# Patient Record
Sex: Female | Born: 1990 | Race: Black or African American | Hispanic: No | Marital: Single | Smoking: Never smoker
Health system: Southern US, Community
[De-identification: ages and names within clinical notes are randomized; demographics above are authoritative.]

## PROBLEM LIST (undated history)

## (undated) ENCOUNTER — Inpatient Hospital Stay (HOSPITAL_COMMUNITY): Payer: Self-pay

## (undated) DIAGNOSIS — Z8619 Personal history of other infectious and parasitic diseases: Secondary | ICD-10-CM

## (undated) DIAGNOSIS — Z202 Contact with and (suspected) exposure to infections with a predominantly sexual mode of transmission: Secondary | ICD-10-CM

## (undated) DIAGNOSIS — F32A Depression, unspecified: Secondary | ICD-10-CM

## (undated) DIAGNOSIS — D649 Anemia, unspecified: Secondary | ICD-10-CM

## (undated) DIAGNOSIS — R200 Anesthesia of skin: Secondary | ICD-10-CM

## (undated) DIAGNOSIS — F419 Anxiety disorder, unspecified: Secondary | ICD-10-CM

## (undated) DIAGNOSIS — F329 Major depressive disorder, single episode, unspecified: Secondary | ICD-10-CM

## (undated) HISTORY — DX: Major depressive disorder, single episode, unspecified: F32.9

## (undated) HISTORY — DX: Depression, unspecified: F32.A

## (undated) HISTORY — PX: ABDOMINAL SURGERY: SHX537

## (undated) HISTORY — DX: Anxiety disorder, unspecified: F41.9

## (undated) HISTORY — DX: Anesthesia of skin: R20.0

## (undated) HISTORY — DX: Personal history of other infectious and parasitic diseases: Z86.19

## (undated) HISTORY — PX: WISDOM TOOTH EXTRACTION: SHX21

---
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
## 2011-02-03 LAB — WET PREP, GENITAL
Trich, Wet Prep: NONE SEEN
Yeast Wet Prep HPF POC: NONE SEEN

## 2011-02-03 LAB — POCT URINALYSIS DIP (DEVICE)
Bilirubin Urine: NEGATIVE
Glucose, UA: NEGATIVE mg/dL
Ketones, ur: NEGATIVE mg/dL
Nitrite: NEGATIVE
Protein, ur: NEGATIVE mg/dL
Specific Gravity, Urine: 1.015 (ref 1.005–1.030)
Urobilinogen, UA: 0.2 mg/dL (ref 0.0–1.0)
pH: 6.5 (ref 5.0–8.0)

## 2011-02-03 LAB — GC/CHLAMYDIA PROBE AMP, GENITAL
Chlamydia, DNA Probe: NEGATIVE
GC Probe Amp, Genital: NEGATIVE

## 2011-02-03 LAB — POCT PREGNANCY, URINE: Preg Test, Ur: NEGATIVE

## 2011-02-03 LAB — RPR: RPR Ser Ql: NONREACTIVE

## 2011-05-31 ENCOUNTER — Inpatient Hospital Stay (INDEPENDENT_AMBULATORY_CARE_PROVIDER_SITE_OTHER)
Admission: RE | Admit: 2011-05-31 | Discharge: 2011-05-31 | Disposition: A | Discharge status: Home / Self Care | Payer: BC Managed Care – PPO | Source: Ambulatory Visit | Attending: Family Medicine | Admitting: Family Medicine

## 2011-05-31 DIAGNOSIS — J029 Acute pharyngitis, unspecified: Secondary | ICD-10-CM

## 2011-07-16 LAB — POCT URINALYSIS DIP (DEVICE)
Bilirubin Urine: NEGATIVE
Glucose, UA: NEGATIVE
Hgb urine dipstick: NEGATIVE
Ketones, ur: 15 — AB
Nitrite: NEGATIVE
Operator id: 239701
Protein, ur: 30 — AB
Specific Gravity, Urine: 1.025
Urobilinogen, UA: 1
pH: 7

## 2011-07-16 LAB — WET PREP, GENITAL
Clue Cells Wet Prep HPF POC: NONE SEEN
Trich, Wet Prep: NONE SEEN
Yeast Wet Prep HPF POC: NONE SEEN

## 2011-07-16 LAB — POCT PREGNANCY, URINE
Operator id: 2397012
Preg Test, Ur: NEGATIVE

## 2011-07-16 LAB — GC/CHLAMYDIA PROBE AMP, GENITAL
Chlamydia, DNA Probe: POSITIVE — AB
GC Probe Amp, Genital: NEGATIVE

## 2011-07-28 ENCOUNTER — Other Ambulatory Visit: Payer: Self-pay | Admitting: Obstetrics and Gynecology

## 2011-07-28 DIAGNOSIS — N6310 Unspecified lump in the right breast, unspecified quadrant: Secondary | ICD-10-CM

## 2011-08-05 ENCOUNTER — Other Ambulatory Visit: Payer: BC Managed Care – PPO

## 2012-07-24 ENCOUNTER — Emergency Department (INDEPENDENT_AMBULATORY_CARE_PROVIDER_SITE_OTHER)
Admission: EM | Admit: 2012-07-24 | Discharge: 2012-07-24 | Disposition: A | Discharge status: Home / Self Care | Payer: Self-pay | Source: Home / Self Care

## 2012-07-24 ENCOUNTER — Encounter (HOSPITAL_COMMUNITY): Payer: Self-pay

## 2012-07-24 DIAGNOSIS — J029 Acute pharyngitis, unspecified: Secondary | ICD-10-CM

## 2012-07-24 DIAGNOSIS — N72 Inflammatory disease of cervix uteri: Secondary | ICD-10-CM

## 2012-07-24 DIAGNOSIS — N898 Other specified noninflammatory disorders of vagina: Secondary | ICD-10-CM

## 2012-07-24 LAB — WET PREP, GENITAL
Clue Cells Wet Prep HPF POC: NONE SEEN
Trich, Wet Prep: NONE SEEN
Yeast Wet Prep HPF POC: NONE SEEN

## 2012-07-24 LAB — POCT URINALYSIS DIP (DEVICE)
Glucose, UA: NEGATIVE mg/dL
Hgb urine dipstick: NEGATIVE
Ketones, ur: NEGATIVE mg/dL
Leukocytes, UA: NEGATIVE
Protein, ur: NEGATIVE mg/dL
Specific Gravity, Urine: 1.015 (ref 1.005–1.030)
Urobilinogen, UA: 0.2 mg/dL (ref 0.0–1.0)
pH: 7 (ref 5.0–8.0)

## 2012-07-24 LAB — POCT PREGNANCY, URINE: Preg Test, Ur: NEGATIVE

## 2012-07-24 MED ORDER — CEFTRIAXONE SODIUM 250 MG IJ SOLR
INTRAMUSCULAR | Status: AC
  Filled 2012-07-24: qty 250

## 2012-07-24 MED ORDER — LIDOCAINE HCL (PF) 1 % IJ SOLN
INTRAMUSCULAR | Status: AC
  Filled 2012-07-24: qty 5

## 2012-07-24 MED ORDER — AZITHROMYCIN 250 MG PO TABS
1000.0000 mg | ORAL_TABLET | Freq: Every day | ORAL | Status: DC
  Administered 2012-07-24: 1000 mg via ORAL

## 2012-07-24 MED ORDER — AZITHROMYCIN 250 MG PO TABS
ORAL_TABLET | ORAL | Status: AC
  Filled 2012-07-24: qty 4

## 2012-07-24 MED ORDER — CEFTRIAXONE SODIUM 250 MG IJ SOLR
250.0000 mg | Freq: Once | INTRAMUSCULAR | Status: AC
  Administered 2012-07-24: 250 mg via INTRAMUSCULAR

## 2012-07-24 MED ORDER — FLUCONAZOLE 150 MG PO TABS: ORAL_TABLET | ORAL | Status: DC

## 2012-07-24 MED ORDER — AZITHROMYCIN 250 MG PO TABS: ORAL_TABLET | ORAL | Status: DC

## 2012-07-24 NOTE — ED Provider Notes (Signed)
Medical screening examination/treatment/procedure(s) were performed by resident physician or non-physician practitioner and as supervising physician I was immediately available for consultation/collaboration.   Barkley Bruns MD.    Linna Hoff, MD 07/24/12 1859

## 2012-07-24 NOTE — ED Notes (Signed)
C/o ST for 1 week, and poss yeast infection (no relief w OTC monistat) c/o irritated and changes in normal vaginal secretions ; no concern for STD at present, only 1 admitted partner (partner in room , asked while pt in BR , denies any STD syx himself)

## 2012-07-24 NOTE — ED Provider Notes (Signed)
History     CSN: [id number]  Arrival date & time 07/24/12  1146   None     Chief Complaint  Patient presents with  . Sore Throat    (Consider location/radiation/quality/duration/timing/severity/associated sxs/prior treatment) HPI Comments: 21 year old female presents with 2 complaints #1 sore throat for 2 days. This is associated with minor voice changes to come and go and postpharyngeal  drainage. States it is better now denies fever chills earache calls any shortness of breath.  2. She is also complaining of a white thick vaginal discharge for several days. Last night she inserted Monistat vaginal suppository. Associated symptoms include itching and irritation. She also complains of pain discomfort within the vagina. Denies suprapubic or pelvic pain.   History reviewed. No pertinent past medical history.  History reviewed. No pertinent past surgical history.  History reviewed. No pertinent family history.  History  Substance Use Topics  . Smoking status: Not on file  . Smokeless tobacco: Not on file  . Alcohol Use: Not on file    OB History    Grav Para Term Preterm Abortions TAB SAB Ect Mult Living                  Review of Systems  Constitutional: Negative for fever, activity change and fatigue.  HENT: Negative.   Respiratory: Negative.   Gastrointestinal: Negative.   Genitourinary: Positive for vaginal discharge and vaginal pain. Negative for dysuria, frequency, flank pain, vaginal bleeding and difficulty urinating.  Musculoskeletal: Negative.   Neurological: Negative.     Allergies  Review of patient's allergies indicates no known allergies.  Home Medications   Current Outpatient Rx  Name Route Sig Dispense Refill  . AZITHROMYCIN 250 MG PO TABS  4 tabs po now 4 each 0  . FLUCONAZOLE 150 MG PO TABS  1 tab po x 1. May repeat in 72 hours if no improvement 2 tablet 0    BP 112/64  Pulse 80  Temp 98.1 F (36.7 C) (Oral)  Resp 18  SpO2 97%  LMP  07/10/2012  Physical Exam  Constitutional: She is oriented to person, place, and time. She appears well-developed and well-nourished. No distress.  HENT:  Head: Normocephalic and atraumatic.  Right Ear: External ear normal.  Left Ear: External ear normal.  Nose: Nose normal.  Mouth/Throat: Oropharynx is clear and moist. No oropharyngeal exudate.  Eyes: Conjunctivae normal and EOM are normal. Pupils are equal, round, and reactive to light.  Neck: Normal range of motion. Neck supple.  Cardiovascular: Normal rate, regular rhythm and normal heart sounds.   Pulmonary/Chest: Effort normal and breath sounds normal. No respiratory distress. She has no wheezes.  Abdominal: Soft. She exhibits no distension. There is no tenderness. There is no rebound.  Genitourinary:       Pelvic exam: External exam reveals a white discharge over the vulvo vaginal area. Insertion of the speculum and with some resistance. The cervix was captured in the ectocervix is mildly erythematous. There is a copious amount of thick white discharge pitting the vagina and the cervix, difficult to tell how much of this might be the Monistat suppository and the discharge. 2 swabs were obtained. Bimanual: No adnexal tenderness mild cervical motion tenderness is positive.  Musculoskeletal: Normal range of motion. She exhibits no tenderness.  Lymphadenopathy:    She has no cervical adenopathy.  Neurological: She is alert and oriented to person, place, and time. No cranial nerve deficit.  Skin: Skin is warm and dry. No rash noted.  No erythema.    ED Course  Procedures (including critical care time)   Labs Reviewed  POCT URINALYSIS DIP (DEVICE)  POCT RAPID STREP A ([facility] ONLY)  POCT PREGNANCY, URINE  WET PREP, GENITAL  GC/CHLAMYDIA PROBE AMP, GENITAL   No results found.   1. Pharyngitis   2. Vaginal Discharge   3. Cervicitis       MDM  Rocephin 250mg  IM now Azithromycin 1 g by mouth now Rx for Diflucan 150 mg  now and repeat in 2 days.       Hayden Rasmussen, NP 07/24/12 1429

## 2012-07-26 LAB — GC/CHLAMYDIA PROBE AMP, GENITAL
Chlamydia, DNA Probe: NEGATIVE
GC Probe Amp, Genital: NEGATIVE

## 2012-08-30 ENCOUNTER — Other Ambulatory Visit: Payer: Self-pay | Admitting: Obstetrics and Gynecology

## 2012-08-30 DIAGNOSIS — N63 Unspecified lump in unspecified breast: Secondary | ICD-10-CM

## 2012-09-05 ENCOUNTER — Ambulatory Visit
Admission: RE | Admit: 2012-09-05 | Discharge: 2012-09-05 | Disposition: A | Discharge status: Home / Self Care | Payer: BC Managed Care – PPO | Source: Ambulatory Visit | Attending: Obstetrics and Gynecology | Admitting: Obstetrics and Gynecology

## 2012-09-05 DIAGNOSIS — N63 Unspecified lump in unspecified breast: Secondary | ICD-10-CM

## 2012-10-14 ENCOUNTER — Encounter (HOSPITAL_COMMUNITY): Payer: Self-pay | Admitting: Emergency Medicine

## 2012-10-14 ENCOUNTER — Emergency Department (INDEPENDENT_AMBULATORY_CARE_PROVIDER_SITE_OTHER)
Admission: EM | Admit: 2012-10-14 | Discharge: 2012-10-14 | Disposition: A | Discharge status: Home / Self Care | Payer: BC Managed Care – PPO | Source: Home / Self Care | Attending: Family Medicine | Admitting: Family Medicine

## 2012-10-14 DIAGNOSIS — J069 Acute upper respiratory infection, unspecified: Secondary | ICD-10-CM

## 2012-10-14 MED ORDER — IPRATROPIUM BROMIDE 0.06 % NA SOLN: 2.0000 | Freq: Four times a day (QID) | NASAL | Status: DC

## 2012-10-14 NOTE — ED Notes (Signed)
Reports runny nose, ears feel clogged and sore throat which started last week Friday.  OTC medications taken but no relief.  Denies vomiting, nausea, and diarrhea.

## 2012-10-14 NOTE — ED Provider Notes (Signed)
History     CSN: [id number]  Arrival date & time 10/14/12  1605   First MD Initiated Contact with Patient 10/14/12 1655      Chief Complaint  Patient presents with  . URI    (Consider location/radiation/quality/duration/timing/severity/associated sxs/prior treatment) Patient is a 21 y.o. female presenting with URI. The history is provided by the patient.  URI The primary symptoms include sore throat and cough. Primary symptoms do not include fever, swollen glands, nausea, vomiting or rash. The current episode started more than 1 week ago. This is a new problem. The problem has been gradually improving.  Symptoms associated with the illness include congestion and rhinorrhea.    History reviewed. No pertinent past medical history.  History reviewed. No pertinent past surgical history.  History reviewed. No pertinent family history.  History  Substance Use Topics  . Smoking status: Not on file  . Smokeless tobacco: Not on file  . Alcohol Use: Not on file    OB History    Grav Para Term Preterm Abortions TAB SAB Ect Mult Living                  Review of Systems  Constitutional: Negative.  Negative for fever.  HENT: Positive for congestion, sore throat, rhinorrhea and postnasal drip.   Respiratory: Positive for cough.   Cardiovascular: Negative.   Gastrointestinal: Negative.  Negative for nausea and vomiting.  Musculoskeletal: Negative.   Skin: Negative for rash.    Allergies  Review of patient's allergies indicates no known allergies.  Home Medications   Current Outpatient Rx  Name  Route  Sig  Dispense  Refill  . FLUCONAZOLE 150 MG PO TABS      1 tab po x 1. May repeat in 72 hours if no improvement   2 tablet   0   . IPRATROPIUM BROMIDE 0.06 % NA SOLN   Nasal   Place 2 sprays into the nose 4 (four) times daily.   15 mL   1     BP 109/65  Pulse 83  Temp 98.2 F (36.8 C) (Oral)  Resp 20  SpO2 98%  Physical Exam  Nursing note and vitals  reviewed. Constitutional: She is oriented to person, place, and time. She appears well-developed and well-nourished.  HENT:  Head: Normocephalic.  Right Ear: External ear normal.  Left Ear: External ear normal.  Nose: Nose normal.  Mouth/Throat: Oropharynx is clear and moist.  Eyes: Conjunctivae normal are normal. Pupils are equal, round, and reactive to light.  Neck: Normal range of motion. Neck supple.  Cardiovascular: Normal rate, regular rhythm, normal heart sounds and intact distal pulses.   Pulmonary/Chest: Effort normal and breath sounds normal.  Lymphadenopathy:    She has no cervical adenopathy.  Neurological: She is alert and oriented to person, place, and time.  Skin: Skin is warm and dry.    ED Course  Procedures (including critical care time)   Labs Reviewed  POCT RAPID STREP A ([facility] ONLY)   No results found.   No diagnosis found.    MDM          Linna Hoff, MD 10/14/12 [phone]

## 2013-04-06 ENCOUNTER — Encounter (HOSPITAL_COMMUNITY): Payer: Self-pay | Admitting: Emergency Medicine

## 2013-04-06 ENCOUNTER — Emergency Department (INDEPENDENT_AMBULATORY_CARE_PROVIDER_SITE_OTHER)
Admission: EM | Admit: 2013-04-06 | Discharge: 2013-04-06 | Disposition: A | Discharge status: Home / Self Care | Payer: BC Managed Care – PPO | Source: Home / Self Care | Attending: Emergency Medicine | Admitting: Emergency Medicine

## 2013-04-06 DIAGNOSIS — J069 Acute upper respiratory infection, unspecified: Secondary | ICD-10-CM

## 2013-04-06 DIAGNOSIS — J020 Streptococcal pharyngitis: Secondary | ICD-10-CM

## 2013-04-06 LAB — POCT RAPID STREP A: Streptococcus, Group A Screen (Direct): POSITIVE — AB

## 2013-04-06 MED ORDER — PENICILLIN G BENZATHINE 1200000 UNIT/2ML IM SUSP
1.2000 10*6.[IU] | Freq: Once | INTRAMUSCULAR | Status: AC
  Administered 2013-04-06: 1.2 10*6.[IU] via INTRAMUSCULAR

## 2013-04-06 MED ORDER — HYDROCODONE-ACETAMINOPHEN 5-325 MG PO TABS: ORAL_TABLET | ORAL | Status: DC

## 2013-04-06 MED ORDER — PENICILLIN G BENZATHINE 1200000 UNIT/2ML IM SUSP
INTRAMUSCULAR | Status: AC
  Filled 2013-04-06: qty 2

## 2013-04-06 MED ORDER — IBUPROFEN 800 MG PO TABS
800.0000 mg | ORAL_TABLET | Freq: Once | ORAL | Status: AC
  Administered 2013-04-06: 800 mg via ORAL

## 2013-04-06 MED ORDER — IBUPROFEN 800 MG PO TABS
ORAL_TABLET | ORAL | Status: AC
  Filled 2013-04-06: qty 1

## 2013-04-06 NOTE — ED Notes (Signed)
Pt given injection will discharge at 5:50 p.m

## 2013-04-06 NOTE — ED Provider Notes (Signed)
Chief Complaint:   Chief Complaint  Patient presents with  . Sore Throat    x 3 days. hurts to swallow. pain radiates to ears. post nasal drip with green sputum.     History of Present Illness:   Candice Bowman is a 22 year old female who's had a three-day history of severe sore throat, pain with swallowing, postnasal drainage, pain in her ear is, headache, nasal congestion, rhinorrhea with green drainage, sinus pressure, has felt hot, and her eyes have been red and irritated. She denies any fever, chills, skin rash, cough, vomiting, abdominal pain, or diarrhea. No known exposure to strep.  Review of Systems:  Other than as noted above, the patient denies any of the following symptoms. Systemic:  No fever, chills, sweats, fatigue, myalgias, headache, or anorexia. Eye:  No redness, pain or drainage. ENT:  No earache, ear congestion, nasal congestion, sneezing, rhinorrhea, sinus pressure, sinus pain, or post nasal drip. Lungs:  No cough, sputum production, wheezing, shortness of breath, or chest pain. GI:  No abdominal pain, nausea, vomiting, or diarrhea. Skin:  No rash or itching.  PMFSH:  Past medical history, family history, social history, meds, allergies, and nurse's notes were reviewed.  There is no known exposure to strep or mono.  No prior history of step or mono.  The patient denies use of tobacco.  Physical Exam:   Vital signs:  BP 110/67  Pulse 83  Temp(Src) 98.6 F (37 C)  Resp 16  SpO2 100%  LMP 03/24/2013 General:  Alert, in no distress. Eye:  No conjunctival injection or drainage. Lids were normal. ENT:  TMs and canals were normal, without erythema or inflammation.  Nasal mucosa was clear and uncongested, without drainage.  Mucous membranes were moist.  Exam of pharynx reveals erythema and swelling, no exudate.  There were no oral ulcerations or lesions. Neck:  Supple, no adenopathy, tenderness or mass. Lungs:  No respiratory distress.  Lungs were clear to auscultation,  without wheezes, rales or rhonchi.  Breath sounds were clear and equal bilaterally.  Heart:  Regular rhythm, without gallops, murmers or rubs. Skin:  Clear, warm, and dry, without rash or lesions.  Labs:   Results for orders placed during the hospital encounter of 04/06/13  POCT RAPID STREP A ([facility] ONLY)      Result Value Range   Streptococcus, Group A Screen (Direct) POSITIVE (*) NEGATIVE    Course in Urgent Care Center:   Given LA Bicillin 1.2 million units IM.  Assessment:  The encounter diagnosis was Strep throat.  No evidence for peritonsillar abscess.  Plan:   1.  The following meds were prescribed:   Discharge Medication List as of 04/06/2013  5:37 PM    START taking these medications   Details  HYDROcodone-acetaminophen (NORCO/VICODIN) 5-325 MG per tablet 1 to 2 tabs every 4 to 6 hours as needed for pain., Print       2.  The patient was instructed in symptomatic care including hot saline gargles, throat lozenges, infectious precautions, and need to trade out toothbrush. Handouts were given. 3.  The patient was told to return if becoming worse in any way, if no better in 3 or 4 days, and given some red flag symptoms such as difficulty swallowing or breathing that would indicate earlier return. 4.  Follow up here if needed.    Reuben Likes, MD 04/06/13 2123

## 2013-04-06 NOTE — ED Notes (Signed)
Pt c/o severe sore throat x 3 days. Hurts to swallow. Pain radiates towards ears. Post nasal drip with green sputum when clearing throat. "felt hot" Denies n/v/d. Pt has taken otc meds with no relief in symptoms.

## 2013-09-30 ENCOUNTER — Encounter (HOSPITAL_COMMUNITY): Payer: Self-pay | Admitting: Emergency Medicine

## 2013-09-30 ENCOUNTER — Emergency Department (HOSPITAL_COMMUNITY)
Admission: EM | Admit: 2013-09-30 | Discharge: 2013-09-30 | Disposition: A | Discharge status: Home / Self Care | Payer: BC Managed Care – PPO | Attending: Emergency Medicine | Admitting: Emergency Medicine

## 2013-09-30 DIAGNOSIS — Z79899 Other long term (current) drug therapy: Secondary | ICD-10-CM | POA: Insufficient documentation

## 2013-09-30 DIAGNOSIS — W260XXA Contact with knife, initial encounter: Secondary | ICD-10-CM | POA: Insufficient documentation

## 2013-09-30 DIAGNOSIS — Y929 Unspecified place or not applicable: Secondary | ICD-10-CM | POA: Insufficient documentation

## 2013-09-30 DIAGNOSIS — S61219A Laceration without foreign body of unspecified finger without damage to nail, initial encounter: Secondary | ICD-10-CM

## 2013-09-30 DIAGNOSIS — S61209A Unspecified open wound of unspecified finger without damage to nail, initial encounter: Secondary | ICD-10-CM | POA: Insufficient documentation

## 2013-09-30 DIAGNOSIS — Y9389 Activity, other specified: Secondary | ICD-10-CM | POA: Insufficient documentation

## 2013-09-30 DIAGNOSIS — Z23 Encounter for immunization: Secondary | ICD-10-CM | POA: Insufficient documentation

## 2013-09-30 MED ORDER — TETANUS-DIPHTH-ACELL PERTUSSIS 5-2.5-18.5 LF-MCG/0.5 IM SUSP
0.5000 mL | Freq: Once | INTRAMUSCULAR | Status: AC
  Administered 2013-09-30: 0.5 mL via INTRAMUSCULAR
  Filled 2013-09-30: qty 0.5

## 2013-09-30 NOTE — ED Notes (Signed)
Patient states she was cutting a lime the long way and slipped and cut her middle left finger.  Bleeding active, controlled by bandage.  Laceration is about 1/2 inch in length.  Patient is CAOx3.

## 2013-09-30 NOTE — ED Notes (Signed)
Bleeding controlled by dressing at this time. Pt denies any other complaints.

## 2013-09-30 NOTE — ED Notes (Signed)
PA at bedside.

## 2013-09-30 NOTE — ED Provider Notes (Signed)
CSN: [id number]     Arrival date & time 09/30/13  2025 History   First MD Initiated Contact with Patient 09/30/13 2042 This chart was scribed for non-physician practitioner Trixie Dredge, PA-C working with Gerhard Munch, MD by Valera Castle, ED scribe. This patient was seen in room TR06C/TR06C and the patient's care was started at 9:25 PM.  Chief Complaint  Patient presents with  . Extremity Laceration   The history is provided by the patient. No language interpreter was used.   HPI Comments: Candice Bowman is a 22 y.o. female who presents to the Emergency Department complaining of a moderate laceration to her left 3rd finger, with active bleeding and mild, throbbing pain, onset immediately PTA when she slipped and cut her finger while cutting a lime. She reports associated numbness to her finger. She states she is right hand dominant. She denies knowing if her tetanus is UTD. She denies any known allergies. She denies weakness, and any other associated symptoms. She denies any medical history.   PCP - No PCP Per Patient   History reviewed. No pertinent past medical history. History reviewed. No pertinent past surgical history. History reviewed. No pertinent family history. History  Substance Use Topics  . Smoking status: Never Smoker   . Smokeless tobacco: Not on file  . Alcohol Use: No   OB History   Grav Para Term Preterm Abortions TAB SAB Ect Mult Living                 Review of Systems  Skin: Positive for wound (laceration to left 3rd finger).  Neurological: Positive for numbness. Negative for weakness.    Allergies  Review of patient's allergies indicates no known allergies.  Home Medications   Current Outpatient Rx  Name  Route  Sig  Dispense  Refill  . fluconazole (DIFLUCAN) 150 MG tablet      1 tab po x 1. May repeat in 72 hours if no improvement   2 tablet   0   . HYDROcodone-acetaminophen (NORCO/VICODIN) 5-325 MG per tablet      1 to 2 tabs every 4 to 6  hours as needed for pain.   20 tablet   0   . ipratropium (ATROVENT) 0.06 % nasal spray   Nasal   Place 2 sprays into the nose 4 (four) times daily.   15 mL   1    BP 109/66  Pulse 81  Temp(Src) 98.3 F (36.8 C) (Oral)  Resp 15  Ht 5\' 5"  (1.651 m)  Wt 119 lb 11.2 oz (54.296 kg)  BMI 19.92 kg/m2  SpO2 97%  LMP 09/17/2013  Physical Exam  Nursing note and vitals reviewed. Constitutional: She appears well-developed and well-nourished. No distress.  HENT:  Head: Normocephalic and atraumatic.  Neck: Neck supple.  Pulmonary/Chest: Effort normal.  Neurological: She is alert.  Skin: She is not diaphoretic.  Linear laceration over dorsal aspect of left third finger.  Actively bleeding.  Sensation decreased distally.  Full AROM of finger.  Capillary refill < 2 seconds.      ED Course  Procedures (including critical care time)  DIAGNOSTIC STUDIES: Oxygen Saturation is 97% on room air, normal by my interpretation.    COORDINATION OF CARE: 9:28 PM-Discussed treatment plan which includes laceration repair with pt at bedside and pt agreed to plan.   LACERATION REPAIR PROCEDURE NOTE The patient's identification was confirmed and consent was obtained. This procedure was performed by Trixie Dredge, PA-C at 9:33 PM. Site: Left  3rd digit Sterile procedures observed: Saline Anesthetic used (type and amt): Digital Block, 3 cc 2% Lidocaine without Epinephrine Suture type/size: 5.0 Vicryl Length: 1.5 cm # of Sutures: 4 Technique: simple interrupted Complexity: Simple Tetanus ordered  Labs Review Labs Reviewed - No data to display Imaging Review No results found.  EKG Interpretation   None       MDM   1. Finger laceration, initial encounter    Pt with laceration over dorsal aspect of left 3rd finger.  No tendon involvement.  Wound hemostatic after suturing.  Discussed findings, treatment, and follow up  with patient.  Pt given return precautions.  Pt verbalizes understanding  and agrees with plan.         I personally performed the services described in this documentation, which was scribed in my presence. The recorded information has been reviewed and is accurate.    Trixie Dredge, PA-C 09/30/13 [phone]

## 2013-10-01 NOTE — ED Provider Notes (Signed)
  Medical screening examination/treatment/procedure(s) were performed by non-physician practitioner and as supervising physician I was immediately available for consultation/collaboration.  EKG Interpretation   None          Gotti Alwin, MD 10/01/13 0034 

## 2013-12-12 ENCOUNTER — Other Ambulatory Visit (HOSPITAL_COMMUNITY)
Admission: RE | Admit: 2013-12-12 | Discharge: 2013-12-12 | Disposition: A | Discharge status: Home / Self Care | Payer: BC Managed Care – PPO | Source: Ambulatory Visit | Attending: Family Medicine | Admitting: Family Medicine

## 2013-12-12 DIAGNOSIS — Z01419 Encounter for gynecological examination (general) (routine) without abnormal findings: Secondary | ICD-10-CM | POA: Insufficient documentation

## 2014-06-30 ENCOUNTER — Emergency Department (HOSPITAL_COMMUNITY)
Admission: EM | Admit: 2014-06-30 | Discharge: 2014-06-30 | Disposition: A | Discharge status: Home / Self Care | Payer: BC Managed Care – PPO

## 2015-10-27 NOTE — L&D Delivery Note (Signed)
Pt progressed to complete and plus one station.  After pushing she began to crown and was taken to the operating room.  Baby girl A was delivered ROA position at 1621 with apgars of 8,9.  SVD  Without difficulty.   Attempted ECV of baby B but it was unsuccessful.  She then agreed to deliver him breech .  I grabbed the feet and then AROM, clear fluid.  The infant was the delivered using breech manuvers at 1635 with apgars of 4,9.  Cord gasses were obtained.  Second degree perineal laceration repaired with 2-0 chromic.  Right second degree periurethral laceration repired with 3-0 chromic.  EBL 450 cc

## 2015-11-08 LAB — OB RESULTS CONSOLE ABO/RH: RH TYPE: POSITIVE

## 2015-11-08 LAB — OB RESULTS CONSOLE ANTIBODY SCREEN: ANTIBODY SCREEN: NEGATIVE

## 2015-11-08 LAB — OB RESULTS CONSOLE GC/CHLAMYDIA
Chlamydia: NEGATIVE
Gonorrhea: NEGATIVE

## 2015-11-08 LAB — OB RESULTS CONSOLE RUBELLA ANTIBODY, IGM: Rubella: IMMUNE

## 2015-11-08 LAB — OB RESULTS CONSOLE HEPATITIS B SURFACE ANTIGEN: Hepatitis B Surface Ag: NEGATIVE

## 2015-11-08 LAB — OB RESULTS CONSOLE HIV ANTIBODY (ROUTINE TESTING): HIV: NONREACTIVE

## 2015-11-08 LAB — OB RESULTS CONSOLE RPR: RPR: NONREACTIVE

## 2015-12-18 ENCOUNTER — Emergency Department (INDEPENDENT_AMBULATORY_CARE_PROVIDER_SITE_OTHER)
Admission: EM | Admit: 2015-12-18 | Discharge: 2015-12-18 | Disposition: A | Discharge status: Home / Self Care | Payer: Medicaid Other | Source: Home / Self Care | Attending: Emergency Medicine | Admitting: Emergency Medicine

## 2015-12-18 ENCOUNTER — Encounter (HOSPITAL_COMMUNITY): Payer: Self-pay | Admitting: Emergency Medicine

## 2015-12-18 DIAGNOSIS — J029 Acute pharyngitis, unspecified: Secondary | ICD-10-CM | POA: Diagnosis not present

## 2015-12-18 MED ORDER — FLUTICASONE PROPIONATE 50 MCG/ACT NA SUSP: 2.0000 | Freq: Every day | NASAL | Status: DC

## 2015-12-18 MED ORDER — LORATADINE 10 MG PO TABS: 10.0000 mg | ORAL_TABLET | Freq: Every day | ORAL | Status: DC

## 2015-12-18 NOTE — ED Provider Notes (Signed)
  HPI  SUBJECTIVE:  Patient reports a right-sided sore throat starting  3 days ago. Sx worse with at night and with swallowing.  Sx better with nothing. Has not tried anything for this. Patient is 4 months pregnant with twins confirmed by ultrasound.. She denies contractions, vaginal bleeding, leakage of fluid. She has been receiving regular prenatal care no Swollen neck glands    + Nasal congestion, minimal cough. No Myalgias No nausea, vomiting, fevers. No Headache No Rash     No Recent Strep Exposure No Abdominal Pain No reflux sxs Questionable Allergy - reports sneezing, but no itchy, watery eyes.  No Breathing difficulty, voice changes No Drooling No Trismus Patient has had sick contacts, steps somewhat similar symptoms. No antipyretic in past 4-6 hrs. Past medical history seasonal allergies, which get worse in the spring. No history of GERD, mono.   History reviewed. No pertinent past medical history.  History reviewed. No pertinent past surgical history.  History reviewed. No pertinent family history.  Social History  Substance Use Topics  . Smoking status: Never Smoker   . Smokeless tobacco: None  . Alcohol Use: No    No current facility-administered medications for this encounter.  Current outpatient prescriptions:  .  Prenatal MV-Min-Fe Fum-FA-DHA (PRENATAL 1 PO), Take by mouth., Disp: , Rfl:  .  fluticasone (FLONASE) 50 MCG/ACT nasal spray, Place 2 sprays into both nostrils daily., Disp: 16 g, Rfl: 0 .  loratadine (CLARITIN) 10 MG tablet, Take 1 tablet (10 mg total) by mouth daily., Disp: 30 tablet, Rfl: 0  No Known Allergies   ROS  As noted in HPI.   Physical Exam  BP 108/78 mmHg  Pulse 74  Temp(Src) 98 F (36.7 C) (Oral)  Resp 17  SpO2 100%  Constitutional: Well developed, well nourished, no acute distress Eyes:  EOMI, conjunctiva normal bilaterally HENT: Normocephalic, atraumatic,mucus membranes moist. +  nasal congestion - erythematous  oropharynx - enlarged tonsils  - exudates. Uvula midline. No sinus tenderness. TMs normal bilaterally. Respiratory: Normal inspiratory effort Cardiovascular: Normal rate, no murmurs, rubs, gallops GI: nondistended, nontender. No appreciable splenomegaly fundus height consistent with dates. skin: No rash, skin intact Lymph: -  cervical LN  Musculoskeletal: no deformities Neurologic: Alert & oriented x 3, no focal neuro deficits Psychiatric: Speech and behavior appropriate. At baseline mental status per caregiver.   ED Course   Medications - No data to display  No orders of the defined types were placed in this encounter.    No results found for this or any previous visit (from the past 24 hour(s)). No results found.  ED Clinical Impression  Pharyngitis   ED Assessment/Plan  Oropharynx normal.  doubt strep. Deferring testing today. Presentation consistent with either viral infection versus seasonal allergies. Home with  Flonase, saline nasal irrigation, Claritin, Tylenol. Follow-up with PMD as needed. Discussed signs and symptoms that should prompt return to the emergency department. Patient agrees with plan.  *This clinic note was created using Dragon dictation software. Therefore, there may be occasional mistakes despite careful proofreading.    Domenick Gong, MD 12/18/15 2130

## 2015-12-18 NOTE — ED Notes (Signed)
Pt has been suffering from nasal congestion, a cough, and right sided throat pain for three days.  Pt is pregnant and did not want to take anything OTC.

## 2016-04-30 LAB — OB RESULTS CONSOLE GBS: STREP GROUP B AG: NEGATIVE

## 2016-05-06 ENCOUNTER — Inpatient Hospital Stay (HOSPITAL_COMMUNITY): Payer: BLUE CROSS/BLUE SHIELD

## 2016-05-06 ENCOUNTER — Inpatient Hospital Stay (HOSPITAL_COMMUNITY)
Admission: AD | Admit: 2016-05-06 | Discharge: 2016-05-06 | Disposition: A | Discharge status: Home / Self Care | Payer: BLUE CROSS/BLUE SHIELD | Source: Ambulatory Visit | Attending: Obstetrics and Gynecology | Admitting: Obstetrics and Gynecology

## 2016-05-06 ENCOUNTER — Encounter (HOSPITAL_COMMUNITY): Payer: Self-pay | Admitting: *Deleted

## 2016-05-06 DIAGNOSIS — O368130 Decreased fetal movements, third trimester, not applicable or unspecified: Secondary | ICD-10-CM | POA: Diagnosis not present

## 2016-05-06 DIAGNOSIS — Z3A35 35 weeks gestation of pregnancy: Secondary | ICD-10-CM | POA: Insufficient documentation

## 2016-05-06 DIAGNOSIS — O30003 Twin pregnancy, unspecified number of placenta and unspecified number of amniotic sacs, third trimester: Secondary | ICD-10-CM | POA: Diagnosis not present

## 2016-05-06 DIAGNOSIS — R109 Unspecified abdominal pain: Secondary | ICD-10-CM | POA: Diagnosis present

## 2016-05-06 DIAGNOSIS — O368190 Decreased fetal movements, unspecified trimester, not applicable or unspecified: Secondary | ICD-10-CM

## 2016-05-06 HISTORY — DX: Anemia, unspecified: D64.9

## 2016-05-06 HISTORY — DX: Contact with and (suspected) exposure to infections with a predominantly sexual mode of transmission: Z20.2

## 2016-05-06 LAB — AMNISURE RUPTURE OF MEMBRANE (ROM) NOT AT ARMC: AMNISURE: NEGATIVE

## 2016-05-06 NOTE — MAU Provider Note (Signed)
Assumed care.  S: Call from RN that pt is also c/o LOF. +Cramping, but bearable. Declines Tylenol. Reports FM of both babies at present.  O: Today's Vitals   05/06/16 1710 05/06/16 2045  BP: 117/64 104/65  Pulse: 88 88  Temp: 98.3 F (36.8 C)   TempSrc: Oral   Resp: 16 16  Height: 5\' 5"  (1.651 m)   Weight: 76.204 kg (168 lb)    SSE: small amount of white non-odorous discharge present. Sample collected for fern slide and Amnisure. No pooling or valsalva. Fern neg. Cvx visually closed.    Results for orders placed or performed during the hospital encounter of 05/06/16 (from the past 24 hour(s))  Amnisure rupture [facility] membrane (rom)[facility]     Status: None   Collection Time: 05/06/16  8:20 PM  Result Value Ref Range   Amnisure ROM NEGATIVE      A: 25 yo G1P0 @ 35.5 wks here for ? ROM and decreased FM of baby B. Preliminary U/S report = BPP 8/8 for both fetuses w/ largest fluid pocket of 2.4 cm for Twin A, and 4.3 cm for Twin B. Twin A cephalic. Twin B Breech. +Ctxs & irritability on toco, palpate mild. ROM ruled out - appears leukorrhea.   P: Reassurance. Strip reviewed by Dr. Sallye OberKulwa. Ok to send home w/ strict PTL precautions. Continue FKCs per protocol. Tyl prn cramping. Rest. Hydration. Condoms during intercourse. OB f/u as scheduled.   Sherre ScarletKimberly Lennix Bowman, CNM 05/06/16, 8:39 PM

## 2016-05-06 NOTE — Discharge Instructions (Signed)
Fetal Movement Counts  Patient Name: __________________________________________________ Patient Due Date: ____________________  Performing a fetal movement count is highly recommended in high-risk pregnancies, but it is good for every pregnant woman to do. Your health care provider may ask you to start counting fetal movements at 28 weeks of the pregnancy. Fetal movements often increase:  · After eating a full meal.  · After physical activity.  · After eating or drinking something sweet or cold.  · At rest.  Pay attention to when you feel the baby is most active. This will help you notice a pattern of your baby's sleep and wake cycles and what factors contribute to an increase in fetal movement. It is important to perform a fetal movement count at the same time each day when your baby is normally most active.   HOW TO COUNT FETAL MOVEMENTS  1. Find a quiet and comfortable area to sit or lie down on your left side. Lying on your left side provides the best blood and oxygen circulation to your baby.  2. Write down the day and time on a sheet of paper or in a journal.  3. Start counting kicks, flutters, swishes, rolls, or jabs in a 2-hour period. You should feel at least 10 movements within 2 hours.  4. If you do not feel 10 movements in 2 hours, wait 2-3 hours and count again. Look for a change in the pattern or not enough counts in 2 hours.  SEEK MEDICAL CARE IF:  · You feel less than 10 counts in 2 hours, tried twice.  · There is no movement in over an hour.  · The pattern is changing or taking longer each day to reach 10 counts in 2 hours.  · You feel the baby is not moving as he or she usually does.  Date: ____________ Movements: ____________ Start time: ____________ Finish time: ____________   Date: ____________ Movements: ____________ Start time: ____________ Finish time: ____________  Date: ____________ Movements: ____________ Start time: ____________ Finish time: ____________  Date: ____________ Movements:  ____________ Start time: ____________ Finish time: ____________  Date: ____________ Movements: ____________ Start time: ____________ Finish time: ____________  Date: ____________ Movements: ____________ Start time: ____________ Finish time: ____________  Date: ____________ Movements: ____________ Start time: ____________ Finish time: ____________  Date: ____________ Movements: ____________ Start time: ____________ Finish time: ____________   Date: ____________ Movements: ____________ Start time: ____________ Finish time: ____________  Date: ____________ Movements: ____________ Start time: ____________ Finish time: ____________  Date: ____________ Movements: ____________ Start time: ____________ Finish time: ____________  Date: ____________ Movements: ____________ Start time: ____________ Finish time: ____________  Date: ____________ Movements: ____________ Start time: ____________ Finish time: ____________  Date: ____________ Movements: ____________ Start time: ____________ Finish time: ____________  Date: ____________ Movements: ____________ Start time: ____________ Finish time: ____________   Date: ____________ Movements: ____________ Start time: ____________ Finish time: ____________  Date: ____________ Movements: ____________ Start time: ____________ Finish time: ____________  Date: ____________ Movements: ____________ Start time: ____________ Finish time: ____________  Date: ____________ Movements: ____________ Start time: ____________ Finish time: ____________  Date: ____________ Movements: ____________ Start time: ____________ Finish time: ____________  Date: ____________ Movements: ____________ Start time: ____________ Finish time: ____________  Date: ____________ Movements: ____________ Start time: ____________ Finish time: ____________   Date: ____________ Movements: ____________ Start time: ____________ Finish time: ____________  Date: ____________ Movements: ____________ Start time: ____________ Finish  time: ____________  Date: ____________ Movements: ____________ Start time: ____________ Finish time: ____________  Date: ____________ Movements: ____________ Start time:   ____________ Finish time: ____________  Date: ____________ Movements: ____________ Start time: ____________ Finish time: ____________  Date: ____________ Movements: ____________ Start time: ____________ Finish time: ____________  Date: ____________ Movements: ____________ Start time: ____________ Finish time: ____________   Date: ____________ Movements: ____________ Start time: ____________ Finish time: ____________  Date: ____________ Movements: ____________ Start time: ____________ Finish time: ____________  Date: ____________ Movements: ____________ Start time: ____________ Finish time: ____________  Date: ____________ Movements: ____________ Start time: ____________ Finish time: ____________  Date: ____________ Movements: ____________ Start time: ____________ Finish time: ____________  Date: ____________ Movements: ____________ Start time: ____________ Finish time: ____________  Date: ____________ Movements: ____________ Start time: ____________ Finish time: ____________   Date: ____________ Movements: ____________ Start time: ____________ Finish time: ____________  Date: ____________ Movements: ____________ Start time: ____________ Finish time: ____________  Date: ____________ Movements: ____________ Start time: ____________ Finish time: ____________  Date: ____________ Movements: ____________ Start time: ____________ Finish time: ____________  Date: ____________ Movements: ____________ Start time: ____________ Finish time: ____________  Date: ____________ Movements: ____________ Start time: ____________ Finish time: ____________  Date: ____________ Movements: ____________ Start time: ____________ Finish time: ____________   Date: ____________ Movements: ____________ Start time: ____________ Finish time: ____________  Date: ____________  Movements: ____________ Start time: ____________ Finish time: ____________  Date: ____________ Movements: ____________ Start time: ____________ Finish time: ____________  Date: ____________ Movements: ____________ Start time: ____________ Finish time: ____________  Date: ____________ Movements: ____________ Start time: ____________ Finish time: ____________  Date: ____________ Movements: ____________ Start time: ____________ Finish time: ____________  Date: ____________ Movements: ____________ Start time: ____________ Finish time: ____________   Date: ____________ Movements: ____________ Start time: ____________ Finish time: ____________  Date: ____________ Movements: ____________ Start time: ____________ Finish time: ____________  Date: ____________ Movements: ____________ Start time: ____________ Finish time: ____________  Date: ____________ Movements: ____________ Start time: ____________ Finish time: ____________  Date: ____________ Movements: ____________ Start time: ____________ Finish time: ____________  Date: ____________ Movements: ____________ Start time: ____________ Finish time: ____________     This information is not intended to replace advice given to you by your health care provider. Make sure you discuss any questions you have with your health care provider.     Document Released: 11/11/2006 Document Revised: 11/02/2014 Document Reviewed: 08/08/2012  Elsevier Interactive Patient Education ©2016 [employer]

## 2016-05-18 ENCOUNTER — Inpatient Hospital Stay (HOSPITAL_COMMUNITY)
Admission: AD | Admit: 2016-05-18 | Discharge: 2016-05-19 | Disposition: A | Discharge status: Home / Self Care | Payer: BLUE CROSS/BLUE SHIELD | Source: Ambulatory Visit | Attending: Obstetrics & Gynecology | Admitting: Obstetrics & Gynecology

## 2016-05-18 ENCOUNTER — Encounter (HOSPITAL_COMMUNITY): Payer: Self-pay | Admitting: *Deleted

## 2016-05-18 DIAGNOSIS — Z3493 Encounter for supervision of normal pregnancy, unspecified, third trimester: Secondary | ICD-10-CM | POA: Insufficient documentation

## 2016-05-18 DIAGNOSIS — Z3A38 38 weeks gestation of pregnancy: Secondary | ICD-10-CM | POA: Insufficient documentation

## 2016-05-18 NOTE — MAU Note (Signed)
Pt reports increasing cramping. States it has been happening for 2 days but worsening tonight. Denies bleeding or ROM

## 2016-05-19 DIAGNOSIS — Z3493 Encounter for supervision of normal pregnancy, unspecified, third trimester: Secondary | ICD-10-CM | POA: Diagnosis present

## 2016-05-19 DIAGNOSIS — Z3A38 38 weeks gestation of pregnancy: Secondary | ICD-10-CM | POA: Diagnosis not present

## 2016-05-19 NOTE — Discharge Instructions (Signed)
Braxton Hicks Contractions °Contractions of the uterus can occur throughout pregnancy. Contractions are not always a sign that you are in labor.  °WHAT ARE BRAXTON HICKS CONTRACTIONS?  °Contractions that occur before labor are called Braxton Hicks contractions, or false labor. Toward the end of pregnancy (32-34 weeks), these contractions can develop more often and may become more forceful. This is not true labor because these contractions do not result in opening (dilatation) and thinning of the cervix. They are sometimes difficult to tell apart from true labor because these contractions can be forceful and people have different pain tolerances. You should not feel embarrassed if you go to the hospital with false labor. Sometimes, the only way to tell if you are in true labor is for your health care provider to look for changes in the cervix. °If there are no prenatal problems or other health problems associated with the pregnancy, it is completely safe to be sent home with false labor and await the onset of true labor. °HOW CAN YOU TELL THE DIFFERENCE BETWEEN TRUE AND FALSE LABOR? °False Labor °· The contractions of false labor are usually shorter and not as hard as those of true labor.   °· The contractions are usually irregular.   °· The contractions are often felt in the front of the lower abdomen and in the groin.   °· The contractions may go away when you walk around or change positions while lying down.   °· The contractions get weaker and are shorter lasting as time goes on.   °· The contractions do not usually become progressively stronger, regular, and closer together as with true labor.   °True Labor °· Contractions in true labor last 30-70 seconds, become very regular, usually become more intense, and increase in frequency.   °· The contractions do not go away with walking.   °· The discomfort is usually felt in the top of the uterus and spreads to the lower abdomen and low back.   °· True labor can be  determined by your health care provider with an exam. This will show that the cervix is dilating and getting thinner.   °WHAT TO REMEMBER °· Keep up with your usual exercises and follow other instructions given by your health care provider.   °· Take medicines as directed by your health care provider.   °· Keep your regular prenatal appointments.   °· Eat and drink lightly if you think you are going into labor.   °· If Braxton Hicks contractions are making you uncomfortable:   °¨ Change your position from lying down or resting to walking, or from walking to resting.   °¨ Sit and rest in a tub of warm water.   °¨ Drink 2-3 glasses of water. Dehydration may cause these contractions.   °¨ Do slow and deep breathing several times an hour.   °WHEN SHOULD I SEEK IMMEDIATE MEDICAL CARE? °Seek immediate medical care if: °· Your contractions become stronger, more regular, and closer together.   °· You have fluid leaking or gushing from your vagina.   °· You have a fever.   °· You pass blood-tinged mucus.   °· You have vaginal bleeding.   °· You have continuous abdominal pain.   °· You have low back pain that you never had before.   °· You feel your baby's head pushing down and causing pelvic pressure.   °· Your baby is not moving as much as it used to.   °  °This information is not intended to replace advice given to you by your health care provider. Make sure you discuss any questions you have with your health care   provider. °  °Document Released: 10/12/2005 Document Revised: 10/17/2013 Document Reviewed: 07/24/2013 °Elsevier Interactive Patient Education ©2016 Elsevier Inc. ° °

## 2016-05-21 ENCOUNTER — Telehealth (HOSPITAL_COMMUNITY): Payer: Self-pay | Admitting: *Deleted

## 2016-05-21 ENCOUNTER — Encounter (HOSPITAL_COMMUNITY): Payer: Self-pay | Admitting: *Deleted

## 2016-05-21 NOTE — Telephone Encounter (Signed)
Preadmission screen  

## 2016-05-26 ENCOUNTER — Encounter (HOSPITAL_COMMUNITY): Payer: Self-pay | Admitting: *Deleted

## 2016-05-26 ENCOUNTER — Other Ambulatory Visit: Payer: Self-pay | Admitting: Obstetrics and Gynecology

## 2016-05-26 ENCOUNTER — Inpatient Hospital Stay (HOSPITAL_COMMUNITY)
Admission: AD | Admit: 2016-05-26 | Discharge: 2016-05-26 | Disposition: A | Discharge status: Home / Self Care | Payer: BLUE CROSS/BLUE SHIELD | Source: Ambulatory Visit | Attending: Obstetrics & Gynecology | Admitting: Obstetrics & Gynecology

## 2016-05-26 DIAGNOSIS — Z3493 Encounter for supervision of normal pregnancy, unspecified, third trimester: Secondary | ICD-10-CM

## 2016-05-26 NOTE — Discharge Instructions (Signed)
Keep your appointment for induction tomorrow. Call the office or provider on call with further concerns prior to your appointment or return to MAU as needed.

## 2016-05-27 ENCOUNTER — Inpatient Hospital Stay (HOSPITAL_COMMUNITY)
Admission: RE | Admit: 2016-05-27 | Discharge: 2016-05-29 | DRG: 775 | Disposition: A | Discharge status: Home / Self Care | Payer: BLUE CROSS/BLUE SHIELD | Source: Ambulatory Visit | Attending: Obstetrics and Gynecology | Admitting: Obstetrics and Gynecology

## 2016-05-27 ENCOUNTER — Inpatient Hospital Stay (HOSPITAL_COMMUNITY): Payer: BLUE CROSS/BLUE SHIELD | Admitting: Anesthesiology

## 2016-05-27 ENCOUNTER — Encounter (HOSPITAL_COMMUNITY)
Admission: RE | Disposition: A | Discharge status: Home / Self Care | Payer: Self-pay | Source: Ambulatory Visit | Attending: Obstetrics and Gynecology

## 2016-05-27 ENCOUNTER — Encounter (HOSPITAL_COMMUNITY): Payer: Self-pay

## 2016-05-27 DIAGNOSIS — O321XX2 Maternal care for breech presentation, fetus 2: Secondary | ICD-10-CM | POA: Diagnosis present

## 2016-05-27 DIAGNOSIS — D649 Anemia, unspecified: Secondary | ICD-10-CM | POA: Diagnosis present

## 2016-05-27 DIAGNOSIS — Z3A38 38 weeks gestation of pregnancy: Secondary | ICD-10-CM

## 2016-05-27 DIAGNOSIS — Z818 Family history of other mental and behavioral disorders: Secondary | ICD-10-CM

## 2016-05-27 DIAGNOSIS — O9902 Anemia complicating childbirth: Secondary | ICD-10-CM | POA: Diagnosis present

## 2016-05-27 DIAGNOSIS — IMO0001 Reserved for inherently not codable concepts without codable children: Secondary | ICD-10-CM

## 2016-05-27 DIAGNOSIS — O30043 Twin pregnancy, dichorionic/diamniotic, third trimester: Secondary | ICD-10-CM | POA: Diagnosis present

## 2016-05-27 LAB — CBC
HEMATOCRIT: 32.5 % — AB (ref 36.0–46.0)
HEMOGLOBIN: 11.3 g/dL — AB (ref 12.0–15.0)
MCH: 28.8 pg (ref 26.0–34.0)
MCHC: 34.8 g/dL (ref 30.0–36.0)
MCV: 82.9 fL (ref 78.0–100.0)
Platelets: 227 10*3/uL (ref 150–400)
RBC: 3.92 MIL/uL (ref 3.87–5.11)
RDW: 14.9 % (ref 11.5–15.5)
WBC: 12.4 10*3/uL — AB (ref 4.0–10.5)

## 2016-05-27 LAB — ABO/RH: ABO/RH(D): A POS

## 2016-05-27 LAB — TYPE AND SCREEN
ABO/RH(D): A POS
ANTIBODY SCREEN: NEGATIVE

## 2016-05-27 SURGERY — Surgical Case
Anesthesia: Epidural

## 2016-05-27 MED ORDER — ZOLPIDEM TARTRATE 5 MG PO TABS: 5.0000 mg | ORAL_TABLET | Freq: Every evening | ORAL | Status: DC | PRN

## 2016-05-27 MED ORDER — SIMETHICONE 80 MG PO CHEW: 80.0000 mg | CHEWABLE_TABLET | ORAL | Status: DC | PRN

## 2016-05-27 MED ORDER — OXYCODONE-ACETAMINOPHEN 5-325 MG PO TABS: 2.0000 | ORAL_TABLET | ORAL | Status: DC | PRN

## 2016-05-27 MED ORDER — COCONUT OIL OIL: 1.0000 "application " | TOPICAL_OIL | Status: DC | PRN

## 2016-05-27 MED ORDER — LIDOCAINE HCL (PF) 1 % IJ SOLN
INTRAMUSCULAR | Status: DC | PRN
  Administered 2016-05-27: 8 mL via EPIDURAL
  Administered 2016-05-27: 5 mL via EPIDURAL

## 2016-05-27 MED ORDER — PHENYLEPHRINE 40 MCG/ML (10ML) SYRINGE FOR IV PUSH (FOR BLOOD PRESSURE SUPPORT)
80.0000 ug | PREFILLED_SYRINGE | INTRAVENOUS | Status: DC | PRN
  Filled 2016-05-27: qty 10

## 2016-05-27 MED ORDER — EPHEDRINE 5 MG/ML INJ: 10.0000 mg | INTRAVENOUS | Status: DC | PRN

## 2016-05-27 MED ORDER — LACTATED RINGERS IV SOLN: 500.0000 mL | INTRAVENOUS | Status: DC | PRN

## 2016-05-27 MED ORDER — FLUTICASONE PROPIONATE 50 MCG/ACT NA SUSP: 2.0000 | Freq: Every day | NASAL | Status: DC | PRN

## 2016-05-27 MED ORDER — SENNOSIDES-DOCUSATE SODIUM 8.6-50 MG PO TABS
2.0000 | ORAL_TABLET | ORAL | Status: DC
  Administered 2016-05-28 – 2016-05-29 (×2): 2 via ORAL
  Filled 2016-05-27 (×2): qty 2

## 2016-05-27 MED ORDER — OXYTOCIN BOLUS FROM INFUSION: 500.0000 mL | Freq: Once | INTRAVENOUS | Status: DC

## 2016-05-27 MED ORDER — DIPHENHYDRAMINE HCL 50 MG/ML IJ SOLN: 12.5000 mg | INTRAMUSCULAR | Status: DC | PRN

## 2016-05-27 MED ORDER — FLEET ENEMA 7-19 GM/118ML RE ENEM: 1.0000 | ENEMA | Freq: Every day | RECTAL | Status: DC | PRN

## 2016-05-27 MED ORDER — PRENATAL MULTIVITAMIN CH
1.0000 | ORAL_TABLET | Freq: Every day | ORAL | Status: DC
  Administered 2016-05-28 – 2016-05-29 (×2): 1 via ORAL
  Filled 2016-05-27 (×2): qty 1

## 2016-05-27 MED ORDER — FENTANYL 2.5 MCG/ML BUPIVACAINE 1/10 % EPIDURAL INFUSION (WH - ANES)
14.0000 mL/h | INTRAMUSCULAR | Status: DC | PRN
  Administered 2016-05-27: 14 mL/h via EPIDURAL
  Filled 2016-05-27: qty 125

## 2016-05-27 MED ORDER — ACETAMINOPHEN 325 MG PO TABS
650.0000 mg | ORAL_TABLET | ORAL | Status: DC | PRN
  Administered 2016-05-29: 650 mg via ORAL
  Filled 2016-05-27: qty 2

## 2016-05-27 MED ORDER — TETANUS-DIPHTH-ACELL PERTUSSIS 5-2.5-18.5 LF-MCG/0.5 IM SUSP: 0.5000 mL | Freq: Once | INTRAMUSCULAR | Status: DC

## 2016-05-27 MED ORDER — OXYCODONE-ACETAMINOPHEN 5-325 MG PO TABS: 1.0000 | ORAL_TABLET | ORAL | Status: DC | PRN

## 2016-05-27 MED ORDER — OXYTOCIN 40 UNITS IN LACTATED RINGERS INFUSION - SIMPLE MED
2.5000 [IU]/h | INTRAVENOUS | Status: DC
  Filled 2016-05-27: qty 1000

## 2016-05-27 MED ORDER — ONDANSETRON HCL 4 MG PO TABS: 4.0000 mg | ORAL_TABLET | ORAL | Status: DC | PRN

## 2016-05-27 MED ORDER — SODIUM BICARBONATE 8.4 % IV SOLN
INTRAVENOUS | Status: DC | PRN
  Administered 2016-05-27 (×2): 5 mL via EPIDURAL

## 2016-05-27 MED ORDER — BENZOCAINE-MENTHOL 20-0.5 % EX AERO: 1.0000 "application " | INHALATION_SPRAY | CUTANEOUS | Status: DC | PRN

## 2016-05-27 MED ORDER — SIMETHICONE 80 MG PO CHEW
80.0000 mg | CHEWABLE_TABLET | Freq: Four times a day (QID) | ORAL | Status: DC | PRN
  Administered 2016-05-27: 80 mg via ORAL
  Filled 2016-05-27: qty 1

## 2016-05-27 MED ORDER — DIBUCAINE 1 % RE OINT: 1.0000 "application " | TOPICAL_OINTMENT | RECTAL | Status: DC | PRN

## 2016-05-27 MED ORDER — TERBUTALINE SULFATE 1 MG/ML IJ SOLN: 0.2500 mg | Freq: Once | INTRAMUSCULAR | Status: DC | PRN

## 2016-05-27 MED ORDER — DIPHENHYDRAMINE HCL 25 MG PO CAPS
25.0000 mg | ORAL_CAPSULE | Freq: Four times a day (QID) | ORAL | Status: DC | PRN
Start: 2016-05-27 — End: 2016-05-29

## 2016-05-27 MED ORDER — LIDOCAINE HCL (PF) 1 % IJ SOLN: 30.0000 mL | INTRAMUSCULAR | Status: DC | PRN

## 2016-05-27 MED ORDER — IBUPROFEN 600 MG PO TABS
600.0000 mg | ORAL_TABLET | Freq: Four times a day (QID) | ORAL | Status: DC
  Administered 2016-05-27 – 2016-05-29 (×8): 600 mg via ORAL
  Filled 2016-05-27 (×8): qty 1

## 2016-05-27 MED ORDER — LACTATED RINGERS IV SOLN
INTRAVENOUS | Status: DC
  Administered 2016-05-27 (×2): via INTRAVENOUS

## 2016-05-27 MED ORDER — ACETAMINOPHEN 325 MG PO TABS: 650.0000 mg | ORAL_TABLET | ORAL | Status: DC | PRN

## 2016-05-27 MED ORDER — LACTATED RINGERS IV SOLN
500.0000 mL | Freq: Once | INTRAVENOUS | Status: AC
  Administered 2016-05-27: 500 mL via INTRAVENOUS

## 2016-05-27 MED ORDER — ONDANSETRON HCL 4 MG/2ML IJ SOLN
4.0000 mg | Freq: Four times a day (QID) | INTRAMUSCULAR | Status: DC | PRN
  Administered 2016-05-27: 4 mg via INTRAVENOUS
  Filled 2016-05-27: qty 2

## 2016-05-27 MED ORDER — ONDANSETRON HCL 4 MG/2ML IJ SOLN: 4.0000 mg | INTRAMUSCULAR | Status: DC | PRN

## 2016-05-27 MED ORDER — OXYTOCIN 40 UNITS IN LACTATED RINGERS INFUSION - SIMPLE MED
1.0000 m[IU]/min | INTRAVENOUS | Status: DC
  Administered 2016-05-27: 8 m[IU]/min via INTRAVENOUS
  Administered 2016-05-27: 2 m[IU]/min via INTRAVENOUS
  Administered 2016-05-27: 6 m[IU]/min via INTRAVENOUS

## 2016-05-27 MED ORDER — SOD CITRATE-CITRIC ACID 500-334 MG/5ML PO SOLN
30.0000 mL | ORAL | Status: DC | PRN
  Filled 2016-05-27: qty 15

## 2016-05-27 MED ORDER — WITCH HAZEL-GLYCERIN EX PADS: 1.0000 "application " | MEDICATED_PAD | CUTANEOUS | Status: DC | PRN

## 2016-05-27 SURGICAL SUPPLY — 36 items
BENZOIN TINCTURE PRP APPL 2/3 (GAUZE/BANDAGES/DRESSINGS) ×3 IMPLANT
CHLORAPREP W/TINT 26ML (MISCELLANEOUS) ×3 IMPLANT
CLAMP CORD UMBIL (MISCELLANEOUS) IMPLANT
CLOSURE WOUND 1/2 X4 (GAUZE/BANDAGES/DRESSINGS) ×1
CLOTH BEACON ORANGE TIMEOUT ST (SAFETY) ×3 IMPLANT
CONTAINER PREFILL 10% NBF 15ML (MISCELLANEOUS) IMPLANT
DRAIN JACKSON PRT FLT 10 (DRAIN) IMPLANT
DRSG OPSITE POSTOP 4X10 (GAUZE/BANDAGES/DRESSINGS) ×3 IMPLANT
ELECT REM PT RETURN 9FT ADLT (ELECTROSURGICAL) ×3
ELECTRODE REM PT RTRN 9FT ADLT (ELECTROSURGICAL) ×1 IMPLANT
EVACUATOR SILICONE 100CC (DRAIN) IMPLANT
EXTRACTOR VACUUM M CUP 4 TUBE (SUCTIONS) IMPLANT
EXTRACTOR VACUUM M CUP 4' TUBE (SUCTIONS)
GLOVE BIO SURGEON STRL SZ 6.5 (GLOVE) ×2 IMPLANT
GLOVE BIO SURGEONS STRL SZ 6.5 (GLOVE) ×1
GLOVE BIOGEL PI IND STRL 7.0 (GLOVE) ×2 IMPLANT
GLOVE BIOGEL PI INDICATOR 7.0 (GLOVE) ×4
GOWN STRL REUS W/TWL LRG LVL3 (GOWN DISPOSABLE) ×6 IMPLANT
KIT ABG SYR 3ML LUER SLIP (SYRINGE) IMPLANT
NEEDLE HYPO 25X5/8 SAFETYGLIDE (NEEDLE) IMPLANT
NS IRRIG 1000ML POUR BTL (IV SOLUTION) ×3 IMPLANT
PACK C SECTION WH (CUSTOM PROCEDURE TRAY) ×3 IMPLANT
PAD OB MATERNITY 4.3X12.25 (PERSONAL CARE ITEMS) ×3 IMPLANT
PENCIL SMOKE EVAC W/HOLSTER (ELECTROSURGICAL) ×3 IMPLANT
RTRCTR C-SECT PINK 25CM LRG (MISCELLANEOUS) IMPLANT
STRIP CLOSURE SKIN 1/2X4 (GAUZE/BANDAGES/DRESSINGS) ×2 IMPLANT
SUT CHROMIC 0 CT 1 (SUTURE) ×3 IMPLANT
SUT MNCRL AB 3-0 PS2 27 (SUTURE) ×3 IMPLANT
SUT PLAIN 2 0 (SUTURE) ×4
SUT PLAIN 2 0 XLH (SUTURE) ×3 IMPLANT
SUT PLAIN ABS 2-0 CT1 27XMFL (SUTURE) ×2 IMPLANT
SUT SILK 2 0 SH (SUTURE) IMPLANT
SUT VIC AB 0 CTX 36 (SUTURE) ×8
SUT VIC AB 0 CTX36XBRD ANBCTRL (SUTURE) ×4 IMPLANT
TOWEL OR 17X24 6PK STRL BLUE (TOWEL DISPOSABLE) ×3 IMPLANT
TRAY FOLEY CATH SILVER 14FR (SET/KITS/TRAYS/PACK) ×3 IMPLANT

## 2016-05-27 NOTE — Anesthesia Procedure Notes (Signed)
Epidural Patient location during procedure: OB Start time: 05/27/2016 11:43 AM End time: 05/27/2016 11:47 AM  Staffing Anesthesiologist: Leilani Able Performed: anesthesiologist   Preanesthetic Checklist Completed: patient identified, site marked, surgical consent, pre-op evaluation, timeout performed, IV checked, risks and benefits discussed and monitors and equipment checked  Epidural Patient position: sitting Prep: site prepped and draped and DuraPrep Patient monitoring: continuous pulse ox and blood pressure Approach: midline Location: L3-L4 Injection technique: LOR air  Needle:  Needle type: Tuohy  Needle gauge: 17 G Needle length: 9 cm and 9 Needle insertion depth: 5 cm cm Catheter type: closed end flexible Catheter size: 19 Gauge Catheter at skin depth: 10 cm Test dose: negative and Other  Assessment Sensory level: T9 Events: blood not aspirated, injection not painful, no injection resistance, negative IV test and no paresthesia

## 2016-05-27 NOTE — Progress Notes (Signed)
Patient ID: Candice Bowman, female   DOB: 1991-04-28, 25 y.o.   MRN: [id number] Pt without c/o Blood pressure 133/61, pulse 89, temperature 98.6 F (37 C), temperature source Oral, resp. rate 18, height 5' 5.5" (1.664 m), weight 180 lb (81.6 kg), SpO2 99 %. category 1 times two Complete and plus one station Will start to push.  Anticipate SVD

## 2016-05-27 NOTE — Anesthesia Pain Management Evaluation Note (Signed)
  CRNA Pain Management Visit Note  Patient: Candice Bowman, 25 y.o., female  "Hello I am a member of the anesthesia team at [facility]. We have an anesthesia team available at all times to provide care throughout the hospital, including epidural management and anesthesia for C-section. I don't know your plan for the delivery whether it a natural birth, water birth, IV sedation, nitrous supplementation, doula or epidural, but we want to meet your pain goals."   1.Was your pain managed to your expectations on prior hospitalizations?   No prior hospitalizations  2.What is your expectation for pain management during this hospitalization?     Epidural  3.How can we help you reach that goal?   Record the patient's initial score and the patient's pain goal.   Pain: 3  Pain Goal: 8 [facility] wants you to be able to say your pain was always managed very well.  Cephus Shelling 05/27/2016

## 2016-05-27 NOTE — Progress Notes (Signed)
Patient ID: Candice Bowman, female   DOB: 28-Jan-1991, 25 y.o.   MRN: [id number] Late entry note Pt  Had pain from contractions BP (!) 142/97   Pulse 92   Temp 98.2 F (36.8 C) (Oral)   Resp 17   Ht 5' 5.5" (1.664 m)   Wt 180 lb (81.6 kg)   SpO2 99%   BMI 29.50 kg/m  Category 1 times two toco q 2-5 minutes 4/90/-2 AROM clear IUPC and FSE applied Anticipate SVD Plan ECV fro baby B

## 2016-05-27 NOTE — H&P (Signed)
Candice Bowman is a 25 y.o. female, G1P0 at 51 5/7 weeks, di/di twins, presenting for induction--had discordant growth at 37 weeks, with Twin A at 61%ile and Twin B at 82%ile, normal fluid for both, vtx/breech.   [place] on 05/21/16 showed Twin A EFW 7+15, normal fluid, vtx, with Twin B EFW 8+1, breech, normal fluid.    Patient Active Problem List   Diagnosis Date Noted  . Twins--di/di 05/27/2016    History of present pregnancy: Patient entered care at 10 weeks.   EDC of 06/05/16 was established by LMP and [place] at 8 4/7 weeks--known di/di twins Anatomy scan:  20 5/7 weeks, with limited anatomy findings of Twin A, suggestive of marginal insertion, anterior placenta, growth 68%ile. Twin B completed anatomy, posterior placenta, growth 61%ile.   Additional [place] evaluations:   Anatomy completed for Twin A at 22 6/7 weeks, no marginal insertion [place] for growth q 4 weeks--at 33 weeks, Twin A EFW 66%ile, Twin B 81%ile.    35 weeks:  Same growth discordancy, vtx/breech 37 weeks:  Twin A vtx, EFW 7+15, 84%ile; Twin B breech, EFW 8+1, 86%ile.   Significant prenatal events:  Boils noted in groin at 35 weeks, treated symptomatically.  Patient has desired vaginal delivery throughout her pregnancy.  Discussion has occurred regarding implications of fetal presentations and provider discretion regarding mode of delivery. Last evaluation:  Seen in MAU 8/1, with cervix 1.5, 100%, Twin A vtx.    OB History    Gravida Para Term Preterm AB Living   1             SAB TAB Ectopic Multiple Live Births                 Past Medical History:  Diagnosis Date  . Anemia   . Chlamydia contact, treated   . Hx of varicella    Past Surgical History:  Procedure Laterality Date  . WISDOM TOOTH EXTRACTION     Family History: family history includes Schizophrenia in her maternal aunt and maternal uncle.   Social History:  reports that she has never smoked. She has never used smokeless tobacco. She reports that she does not  drink alcohol or use drugs. Patient is Tree surgeon, single, employed at [employer], with FOB, [employer].  Prenatal Transfer Tool  Maternal Diabetes: No Genetic Screening: Normal 1st trimester screen and AFP Maternal Ultrasounds/Referrals: Normal growth on last scan 7/27 Fetal Ultrasounds or other Referrals:  None. Maternal Substance Abuse:  No Significant Maternal Medications:  None Significant Maternal Lab Results: Lab values include: Group B Strep negative  TDAP 03/25/16 Flu NA  ROS:  Contractions, +FM  No Known Allergies    BP 110/78 at office on 05/21/16, weight 175. Chest clear Heart RRR without murmur Abd gravid, NT, FH 47 cm at last visit Pelvic: Deferred to time of admission--on 05/26/16, was 1.5, 100%, soft, vtx, -2 in MAU eval by RN. Ext: WNL   Prenatal labs: ABO, Rh: A/Positive/-- (01/13 0000) Antibody: Negative (01/13 0000) Rubella:  Immune RPR: Nonreactive (01/13 0000)  HBsAg: Negative (01/13 0000)  HIV: Non-reactive (01/13 0000)  GBS: Negative (07/06 0000) Sickle cell/Hgb electrophoresis:  AA Pap:  10/2015 WNL GC:  Negative 02/20/16 Chlamydia:  Negative 02/20/16 Genetic screenings:  Normal 1st trimester screen and AFP Glucola:  Elevated 1 hour GTT (155), normal 3 hour GTT Other:   Hgb 13.3 at NOB, 10.8 at 28 weeks       Assessment/Plan: IUP of di/di tiwns at  38 5/7 weeks Hx discordant growth at 37 weeks, appears to be concordant on most recent US. Vtx/breech on [place] 05/21/16 GBS negative  Plan: Admit to Birthing Suite per consult with Dr. Normand Sloop Routine [facility] orders Pain med/epidural prn Dr. Normand Sloop will evaluate presentation of both twins and discuss plan of care with the patient. If decision is made to pursue vaginal delivery, Dr. Normand Sloop will determine mode of induction.  Laymond Postle, [place], [place] 05/27/2016, 6:54 AM

## 2016-05-27 NOTE — Anesthesia Preprocedure Evaluation (Signed)
Anesthesia Evaluation  Patient identified by MRN, date of birth, ID band Patient awake    Reviewed: Allergy & Precautions, H&P , NPO status , Patient's Chart, lab work & pertinent test results  Airway Mallampati: I  TM Distance: >3 FB Neck ROM: full    Dental no notable dental hx.    Pulmonary neg pulmonary ROS,    Pulmonary exam normal        Cardiovascular negative cardio ROS Normal cardiovascular exam     Neuro/Psych negative neurological ROS  negative psych ROS   GI/Hepatic negative GI ROS, Neg liver ROS,   Endo/Other  negative endocrine ROS  Renal/GU negative Renal ROS     Musculoskeletal   Abdominal Normal abdominal exam  (+)   Peds  Hematology   Anesthesia Other Findings   Reproductive/Obstetrics (+) Pregnancy                             Anesthesia Physical Anesthesia Plan  ASA: II  Anesthesia Plan: Epidural   Post-op Pain Management:    Induction:   Airway Management Planned:   Additional Equipment:   Intra-op Plan:   Post-operative Plan:   Informed Consent: I have reviewed the patients History and Physical, chart, labs and discussed the procedure including the risks, benefits and alternatives for the proposed anesthesia with the patient or authorized representative who has indicated his/her understanding and acceptance.     Plan Discussed with:   Anesthesia Plan Comments:         Anesthesia Quick Evaluation  

## 2016-05-28 ENCOUNTER — Encounter (HOSPITAL_COMMUNITY): Payer: Self-pay | Admitting: Obstetrics and Gynecology

## 2016-05-28 LAB — CBC
HEMATOCRIT: 25.3 % — AB (ref 36.0–46.0)
HEMOGLOBIN: 8.8 g/dL — AB (ref 12.0–15.0)
MCH: 28.9 pg (ref 26.0–34.0)
MCHC: 34.8 g/dL (ref 30.0–36.0)
MCV: 83 fL (ref 78.0–100.0)
PLATELETS: 176 10*3/uL (ref 150–400)
RBC: 3.05 MIL/uL — AB (ref 3.87–5.11)
RDW: 14.9 % (ref 11.5–15.5)
WBC: 14.8 10*3/uL — AB (ref 4.0–10.5)

## 2016-05-28 LAB — RPR: RPR: NONREACTIVE

## 2016-05-28 MED ORDER — FERROUS SULFATE 325 (65 FE) MG PO TABS
325.0000 mg | ORAL_TABLET | Freq: Every day | ORAL | Status: DC
  Administered 2016-05-28 – 2016-05-29 (×2): 325 mg via ORAL
  Filled 2016-05-28 (×2): qty 1

## 2016-05-28 NOTE — Anesthesia Postprocedure Evaluation (Signed)
Anesthesia Post Note  Patient: Candice Bowman  Procedure(s) Performed: Procedure(s) (LRB): VAGINAL DELIVERY (N/A)  Anesthesia Post Evaluation   Last Vitals:  Vitals:   05/27/16 1746 05/27/16 1852  BP:  139/79  Pulse:  87  Resp: 18 18  Temp:  36.6 C    Last Pain:  Vitals:   05/28/16 0000  TempSrc:   PainSc: 3    Pain Goal:                 [employer]

## 2016-05-28 NOTE — Lactation Note (Signed)
This note was copied from a baby's chart. Lactation Consultation Note  Patient Name: Shruti Neeb [id number] Date: 05/28/2016 Reason for consult: Follow-up assessment;Multiple gestation Follow up visit made.  Mom states both babies have been feeding well today.  She is finding it easier to feed them individually.  Baby boy is currently at breast in football hold.  Latch looks good.  Reminded mom to keep baby close and use good waking techniques/breast massage.  Mom states she has not had time to do extra pumping.  Reassured that is fine as long as both babies are nursing often.  Instructed to pump if they become sleepy or not interested in feeding.  Encouraged to call for assist/concerns.  Maternal Data    Feeding Feeding Type: Breast Fed Length of feed:  (sleepy feeding)  LATCH Score/Interventions Latch: Grasps breast easily, tongue down, lips flanged, rhythmical sucking. Intervention(s): Teach feeding cues;Waking techniques Intervention(s): Breast massage  Audible Swallowing: A few with stimulation  Type of Nipple: Everted at rest and after stimulation  Comfort (Breast/Nipple): Soft / non-tender     Hold (Positioning): No assistance needed to correctly position infant at breast. Intervention(s): Breastfeeding basics reviewed;Support Pillows;Position options  LATCH Score: 9  Lactation Tools Discussed/Used     Consult Status Consult Status: Follow-up Date: 05/29/16 Follow-up type: In-patient    Huston Foley 05/28/2016, 3:35 PM

## 2016-05-28 NOTE — Progress Notes (Signed)
Patient states she is not able to feel her left leg but is able to wiggle her toes. Assisted out of bed with the [facility]. Patient voided 600 of clear yellow urine and perineal care given. Assisted back into bed with the stedy, instructed to call for assistance to the bathroom. She verbalizes her understanding.

## 2016-05-28 NOTE — Progress Notes (Signed)
Patient ambulated out of bed to the bathroom with minimal to no assistance, voided and adequate amount of urine. Ambulated back into bed with no assistance.

## 2016-05-28 NOTE — Clinical Social Work Maternal (Signed)
  CLINICAL SOCIAL WORK MATERNAL/CHILD NOTE  Patient Details  Name: Candice Bowman MRN: [id number] Date of Birth: 04/13/1991  Date:  May 17, 2016  Clinical Social Worker Initiating Note:  Laurey Arrow Date/ Time Initiated:  05/28/16/1200     Child's Name:  Criss Alvine and Rojelio Brenner   Legal Guardian:  Mother   Need for Interpreter:  None   Date of Referral:  04-Nov-2015     Reason for Referral:  Current Substance Use/Substance Use During Pregnancy    Referral Source:  Central Nursery   Address:  [address] [place] [address]  Phone number:  [phone]   Household Members:  Self, Significant Other   Natural Supports (not living in the home):  Extended Family, Immediate Family, Spouse/significant other, Parent   Professional Supports: None   Employment: Full-time   Type of Work: Therapist, art    Education:  Engineer, maintenance Resources:  Multimedia programmer   Other Resources:  [facility]   Cultural/Religious Considerations Which May Impact Care:  Per [employer], MOB is Engineer, manufacturing.  Strengths:  Ability to meet basic needs , Home prepared for child , Pediatrician chosen    Risk Factors/Current Problems:  Substance Use    Cognitive State:  Alert , Insightful , Goal Oriented , Linear Thinking    Mood/Affect:  Bright , Happy , Interested , Relaxed    CSW Assessment:CSW met with MOB to complete an assessment for THC use in pregnancy.  MOB was polite and receptive to meeting with CSW. CSW inquired about MOB's substance use, and MOB acknowledged she utilized marijuana before MOB's pregnancy was confirmed.  MOB reported that after pregnancy confirmation MOB discontinue using marijuana. CSW informed MOB of the hospital's drug screen policy, and informed MOB of the 2 screenings for the twins.  MOB was understanding and denied utilizing any substance prior to her pregnancy confirmation. MOB expressed that MOB was not concerned about the drug screens  and continued to report that MOB is no longer using marijuana.  CSW shared infant B (boy) negative UDS and communicated to MOB that CSW will monitor infant A(girl) UDS.  CSW also informed MOB that CW will continue to monitor the twins cord, and will make a report to CPS if needed.  MOB stated she was not concerned, and did not have any questions about the hospital's policy. CSW educated MOB about PPD. CSW informed MOB of possible supports and interventions to decrease PPD.  CSW also encouraged MOB to seek medical attention if needed for increased signs and symptoms for PPD. CSW also reviewed safe sleep, and SIDS. MOB was knowledgeable.  MOB communicated that she has a bassinet for the twins and is aware of safe sleep interventions. CSW thanked MOB for MOB's willingness to meet with CSW.  MOB did not have any further questions, concerns, or needs at this time.   CSW Plan/Description:  Patient/Family Education , No Further Intervention Required/No Barriers to Discharge, Information/Referral to [employer]  (CSW will monitor twins cord and UDS; report will be made to CPS if needed.)   Laurey Arrow, MSW, [facility] Clinical Social Work [phone]  Dimple Nanas, LCSW August 13, 2016, 1:53 PM

## 2016-05-28 NOTE — Lactation Note (Signed)
This note was copied from a baby's chart. Lactation Consultation Note New mom w/twins. plans to BF and bottle feed BM. Mom had twins BF in football positioned when entered rm. Mom had baby's wrapped in blankets. Baby's skin very warm. Discussed no swaddling during BF, encouraged STS. Mom has good everted nipples. Hand expression taught w/glistening. Encouraged to feel breast before and after BF. Suggested to alternate breast between the twins. Encouraged to BF seprately for a few days to establish feeding habits of each baby. New born behavior discussed. Mom shown how to use DEBP & how to disassemble, clean, & reassemble parts.Mom knows to pump q3h for 15-20 min.Mom encouraged to feed baby 8-12 times/24 hours and with feeding cues. Mom encouraged to waken baby for feeds. Referred to Baby and Me Book in Breastfeeding section Pg. 22-23 for position options and Proper latch demonstration.WH/LC brochure given w/resources, support groups and LC services. Stressed I&O.  Patient Name: Candice Bowman [id number] Date: 05/28/2016 Reason for consult: Initial assessment   Maternal Data Has patient been taught Hand Expression?: Yes Does the patient have breastfeeding experience prior to this delivery?: No  Feeding Feeding Type: Breast Fed Length of feed: 25 min  LATCH Score/Interventions Latch: Grasps breast easily, tongue down, lips flanged, rhythmical sucking. Intervention(s): Adjust position;Breast massage;Breast compression  Audible Swallowing: A few with stimulation Intervention(s): Skin to skin;Hand expression;Alternate breast massage  Type of Nipple: Everted at rest and after stimulation  Comfort (Breast/Nipple): Soft / non-tender     Hold (Positioning): Assistance needed to correctly position infant at breast and maintain latch. Intervention(s): Skin to skin;Position options;Support Pillows;Breastfeeding basics reviewed  LATCH Score: 8  Lactation Tools Discussed/Used Tools:  Pump Breast pump type: Double-Electric Breast Pump WIC Program: Yes Pump Review: Setup, frequency, and cleaning;Milk Storage Initiated by:: Peri Jefferson RN IBCLC Date initiated:: 05/28/16   Consult Status Consult Status: Follow-up Date: 05/28/16 Follow-up type: In-patient    Yiannis Tulloch, Diamond Nickel 05/28/2016, 2:46 AM

## 2016-05-28 NOTE — Lactation Note (Signed)
This note was copied from a baby's chart. Lactation Consultation Note Mom BF baby when entered rm. Twins in football hold. Baby boy was slumped  down between mom and a pillow. Fussy d/t not able to reach breast well. Adjusted position, discussed options, encouraged BF seprately for now d/t learning to BF and also needs to learn baby's feeding habits.  Patient Name: Candice Bowman Mom encouraged to feed baby 8-12 times/24 hours and with feeding cues. Referred to Baby and Me Book in Breastfeeding section Pg. 22-23 for position options and Proper latch demonstration. Mom shown how to use DEBP & how to disassemble, clean, & reassemble parts.Mom knows to pump q3h for 15-20 min. Mom encouraged to waken baby for feeds. WH/LC brochure given w/resources, support groups and LC services. Today's Date: 05/28/2016 Reason for consult: Initial assessment   Maternal Data Has patient been taught Hand Expression?: Yes Does the patient have breastfeeding experience prior to this delivery?: No  Feeding Feeding Type: Breast Fed Length of feed: 25 min  LATCH Score/Interventions Latch: Grasps breast easily, tongue down, lips flanged, rhythmical sucking. Intervention(s): Skin to skin;Teach feeding cues;Waking techniques Intervention(s): Adjust position;Assist with latch;Breast massage;Breast compression  Audible Swallowing: Spontaneous and intermittent  Type of Nipple: Everted at rest and after stimulation  Comfort (Breast/Nipple): Soft / non-tender     Hold (Positioning): Assistance needed to correctly position infant at breast and maintain latch. Intervention(s): Skin to skin;Position options;Support Pillows;Breastfeeding basics reviewed  LATCH Score: 9  Lactation Tools Discussed/Used Tools: Pump Breast pump type: Double-Electric Breast Pump WIC Program: Yes Pump Review: Setup, frequency, and cleaning;Milk Storage Initiated by:: Peri Jefferson RN IBCLC Date initiated:: 05/28/16   Consult  Status Consult Status: Follow-up Date: 05/28/16 Follow-up type: In-patient    Mada Sadik, Diamond Nickel 05/28/2016, 2:55 AM

## 2016-05-28 NOTE — Transfer of Care (Signed)
Immediate Anesthesia Transfer of Care Note  Patient: Candice Bowman  Procedure(s) Performed: Procedure(s): VAGINAL DELIVERY (N/A)  Patient Location: L and D  Anesthesia Type:Epidural  Level of Consciousness: awake  Airway & Oxygen Therapy: Patient Spontanous Breathing  Post-op Assessment: Report given to RN and Post -op Vital signs reviewed and stable  Post vital signs: Reviewed and stable  Last Vitals:  Vitals:   05/27/16 1746 05/27/16 1852  BP:  139/79  Pulse:  87  Resp: 18 18  Temp:  36.6 C    Last Pain:  Vitals:   05/28/16 0000  TempSrc:   PainSc: 3          Complications: No apparent anesthesia complications

## 2016-05-28 NOTE — Progress Notes (Signed)
Patient calls from the bathroom stating she passed a clot. She states she was walking to the bathroom when she felt the clot come out into her pad. She voided and walked back to bed. The clot is about the size of an apple, no placenta visible. Her fundus is firm at midline and bleeding is small. Her bleeding had been moderate throughout the night up until now. No additional clots passed. Vitals: BP 124/68, HR 84, RR 18, temp 98.1. She is not having any additional pain and is comfortable. Will continue to monitor.

## 2016-05-28 NOTE — Progress Notes (Signed)
Candice Bowman, Candice Bowman Female, 25 y.o., 07-29-1991  Post Partum Day 1 Subjective: no complaints. She had passage of a big clot overnight but no more clots since then.  She has small vaginal bleeding this morning.  Ambulating and voiding without difficulty.  Pain well controlled.      Objective: Blood pressure 139/79, pulse 87, temperature 97.8 F (36.6 C), temperature source Oral, resp. rate 18, height 5' 5.5" (1.664 m), weight 81.6 kg (180 lb), SpO2 99 %, unknown if currently breastfeeding.  Physical Exam:  General: alert, cooperative and no distress Lochia: appropriate Uterine Fundus: firm DVT Evaluation: No evidence of DVT seen on physical exam. Calf/Ankle edema is present.   Recent Labs  05/27/16 0810 05/28/16 0509  HGB 11.3* 8.8*  HCT 32.5* 25.3*    Assessment/Plan: Plan for discharge tomorrow, Breastfeeding and Circumcision prior to discharge- circumcision of neonate done today. -I discussed with patient risks, benefits and alternatives of neonate circumcision including risks of bleeding, infection and damage to organs.  All her questions were answered and consent was signed.   -Iron tabs for anemia. -Out of bed and ambulation.   LOS: 1 day   [facility] 05/28/2016, 9:16 AM

## 2016-05-29 MED ORDER — IBUPROFEN 800 MG PO TABS
800.0000 mg | ORAL_TABLET | Freq: Three times a day (TID) | ORAL | 1 refills | Status: DC | PRN
Start: 2016-05-29 — End: 2018-11-09

## 2016-05-29 MED ORDER — FERROUS SULFATE 325 (65 FE) MG PO TABS: 325.0000 mg | ORAL_TABLET | Freq: Three times a day (TID) | ORAL | 3 refills | Status: DC

## 2016-05-29 NOTE — Discharge Instructions (Signed)
Postpartum Depression and Baby Blues °The postpartum period begins right after the birth of a baby. During this time, there is often a great amount of joy and excitement. It is also a time of many changes in the life of the parents. Regardless of how many times a mother gives birth, each child brings new challenges and dynamics to the family. It is not unusual to have feelings of excitement along with confusing shifts in moods, emotions, and thoughts. All mothers are at risk of developing postpartum depression or the "baby blues." These mood changes can occur right after giving birth, or they may occur many months after giving birth. The baby blues or postpartum depression can be mild or severe. Additionally, postpartum depression can go away rather quickly, or it can be a long-term condition.  °CAUSES °Raised hormone levels and the rapid drop in those levels are thought to be a main cause of postpartum depression and the baby blues. A number of hormones change during and after pregnancy. Estrogen and progesterone usually decrease right after the delivery of your baby. The levels of thyroid hormone and various cortisol steroids also rapidly drop. Other factors that play a role in these mood changes include major life events and genetics.  °RISK FACTORS °If you have any of the following risks for the baby blues or postpartum depression, know what symptoms to watch out for during the postpartum period. Risk factors that may increase the likelihood of getting the baby blues or postpartum depression include: °· Having a personal or family history of depression.   °· Having depression while being pregnant.   °· Having premenstrual mood issues or mood issues related to oral contraceptives. °· Having a lot of life stress.   °· Having marital conflict.   °· Lacking a social support network.   °· Having a baby with special needs.   °· Having health problems, such as diabetes.   °SIGNS AND SYMPTOMS °Symptoms of baby blues  include: °· Brief changes in mood, such as going from extreme happiness to sadness. °· Decreased concentration.   °· Difficulty sleeping.   °· Crying spells, tearfulness.   °· Irritability.   °· Anxiety.   °Symptoms of postpartum depression typically begin within the first month after giving birth. These symptoms include: °· Difficulty sleeping or excessive sleepiness.   °· Marked weight loss.   °· Agitation.   °· Feelings of worthlessness.   °· Lack of interest in activity or food.   °Postpartum psychosis is a very serious condition and can be dangerous. Fortunately, it is rare. Displaying any of the following symptoms is cause for immediate medical attention. Symptoms of postpartum psychosis include:  °· Hallucinations and delusions.   °· Bizarre or disorganized behavior.   °· Confusion or disorientation.   °DIAGNOSIS  °A diagnosis is made by an evaluation of your symptoms. There are no medical or lab tests that lead to a diagnosis, but there are various questionnaires that a health care provider may use to identify those with the baby blues, postpartum depression, or psychosis. Often, a screening tool called the Edinburgh Postnatal Depression Scale is used to diagnose depression in the postpartum period.  °TREATMENT °The baby blues usually goes away on its own in 1-2 weeks. Social support is often all that is needed. You will be encouraged to get adequate sleep and rest. Occasionally, you may be given medicines to help you sleep.  °Postpartum depression requires treatment because it can last several months or longer if it is not treated. Treatment may include individual or group therapy, medicine, or both to address any social, physiological, and psychological   factors that may play a role in the depression. Regular exercise, a healthy diet, rest, and social support may also be strongly recommended.  Postpartum psychosis is more serious and needs treatment right away. Hospitalization is often needed. HOME CARE  INSTRUCTIONS  Get as much rest as you can. Nap when the baby sleeps.   Exercise regularly. Some women find yoga and walking to be beneficial.   Eat a balanced and nourishing diet.   Do little things that you enjoy. Have a cup of tea, take a bubble bath, read your favorite magazine, or listen to your favorite music.  Avoid alcohol.   Ask for help with household chores, cooking, grocery shopping, or running errands as needed. Do not try to do everything.   Talk to people close to you about how you are feeling. Get support from your partner, family members, friends, or other new moms.  Try to stay positive in how you think. Think about the things you are grateful for.   Do not spend a lot of time alone.   Only take over-the-counter or prescription medicine as directed by your health care provider.  Keep all your postpartum appointments.   Let your health care provider know if you have any concerns.  SEEK MEDICAL CARE IF: You are having a reaction to or problems with your medicine. SEEK IMMEDIATE MEDICAL CARE IF:  You have suicidal feelings.   You think you may harm the baby or someone else. MAKE SURE YOU:  Understand these instructions.  Will watch your condition.  Will get help right away if you are not doing well or get worse.   This information is not intended to replace advice given to you by your health care provider. Make sure you discuss any questions you have with your health care provider.   Document Released: 07/16/2004 Document Revised: 10/17/2013 Document Reviewed: 07/24/2013 Elsevier Interactive Patient Education 2016 Else[employer] Inc. Postpartum Care After Vaginal Delivery After you deliver your newborn (postpartum period), the usual stay in the hospital is 24-72 hours. If there were problems with your labor or delivery, or if you have other medical problems, you might be in the hospital longer.  While you are in the hospital, you will receive help and  instructions on how to care for yourself and your newborn during the postpartum period.  While you are in the hospital:  Be sure to tell your nurses if you have pain or discomfort, as well as where you feel the pain and what makes the pain worse.  If you had an incision made near your vagina (episiotomy) or if you had some tearing during delivery, the nurses may put ice packs on your episiotomy or tear. The ice packs may help to reduce the pain and swelling.  If you are breastfeeding, you may feel uncomfortable contractions of your uterus for a couple of weeks. This is normal. The contractions help your uterus get back to normal size.  It is normal to have some bleeding after delivery.  For the first 1-3 days after delivery, the flow is red and the amount may be similar to a period.  It is common for the flow to start and stop.  In the first few days, you may pass some small clots. Let your nurses know if you begin to pass large clots or your flow increases.  Do not  flush blood clots down the toilet before having the nurse look at them.  During the next 3-10 days after delivery, your  flow should become more watery and pink or brown-tinged in color. °¨ Ten to fourteen days after delivery, your flow should be a small amount of yellowish-white discharge. °¨ The amount of your flow will decrease over the first few weeks after delivery. Your flow may stop in 6-8 weeks. Most women have had their flow stop by 12 weeks after delivery. °· You should change your sanitary pads frequently. °· Wash your hands thoroughly with soap and water for at least 20 seconds after changing pads, using the toilet, or before holding or feeding your newborn. °· You should feel like you need to empty your bladder within the first 6-8 hours after delivery. °· In case you become weak, lightheaded, or faint, call your nurse before you get out of bed for the first time and before you take a shower for the first time. °· Within  the first few days after delivery, your breasts may begin to feel tender and full. This is called engorgement. Breast tenderness usually goes away within 48-72 hours after engorgement occurs. You may also notice milk leaking from your breasts. If you are not breastfeeding, do not stimulate your breasts. Breast stimulation can make your breasts produce more milk. °· Spending as much time as possible with your newborn is very important. During this time, you and your newborn can feel close and get to know each other. Having your newborn stay in your room (rooming in) will help to strengthen the bond with your newborn.  It will give you time to get to know your newborn and become comfortable caring for your newborn. °· Your hormones change after delivery. Sometimes the hormone changes can temporarily cause you to feel sad or tearful. These feelings should not last more than a few days. If these feelings last longer than that, you should talk to your caregiver. °· If desired, talk to your caregiver about methods of family planning or contraception. °· Talk to your caregiver about immunizations. Your caregiver may want you to have the following immunizations before leaving the hospital: °¨ Tetanus, diphtheria, and pertussis (Tdap) or tetanus and diphtheria (Td) immunization. It is very important that you and your family (including grandparents) or others caring for your newborn are up-to-date with the Tdap or Td immunizations. The Tdap or Td immunization can help protect your newborn from getting ill. °¨ Rubella immunization. °¨ Varicella (chickenpox) immunization. °¨ Influenza immunization. You should receive this annual immunization if you did not receive the immunization during your pregnancy. °  °This information is not intended to replace advice given to you by your health care provider. Make sure you discuss any questions you have with your health care provider. °  °Document Released: 08/09/2007 Document Revised:  07/06/2012 Document Reviewed: 06/08/2012 °Elsevier Interactive Patient Education ©2016 Else[employer] Inc. ° °

## 2016-05-29 NOTE — Discharge Summary (Signed)
[facility] Discharge Summary   Patient Name:   Candice Bowman DOB:     05/19/1991 MRN:     [id number]  Date of Admission:   05/27/2016 Date of Discharge:  05/29/2016  Admitting diagnosis:     38 week and 5 day gestation  Twin pregnancy  Vertex breech presentation  Anemia  Admitted for induction of labor    Discharge diagnosis:    Same                                                                     Post partum procedures: None  Type of Delivery:  Vaginal delivery with breech extraction of the second infant  Delivering Provider:    Dakoda, Bergman [[id number]]  Emree, Sharaf Richmond Campbell [[id number]]  Jaymes Graff   Date of Delivery:  05/27/2016  Newborn Data:      Erlinda, Sarley [[id number]]  Live born female  Birth Weight: 6 lb 8.6 oz (2965 g) APGAR: 8, 9   Carmelina, Viteri [[id number]]  Live born unknown  Birth Weight: 7 lb 6.7 oz (3365 g) APGAR: 4, 9  Baby Feeding:   Bottle and Breast Disposition:   home with mother  Complications:   None  Hospital course:      Induction of Labor With Vaginal Delivery   25 y.o. yo G1P1002 at [redacted]w[redacted]d was admitted to the hospital 05/27/2016 for induction of labor.  Indication for induction: Twin gestation at 38 weeks and 5 days.  Patient had an uncomplicated labor course as follows: Membrane Rupture Time/Date:    Tysheria, Vogt Girl Reighan [[id number]]  11:15 AM   Lars Mage Kaleeyah [[id number]]  4:32 PM ,   Wittney, Towle Girl Jaelynne [[id number]]  05/27/2016   Udell, Haspel Eunique [[id number]]  05/27/2016   Intrapartum Procedures: Episiotomy:    Winnefred, Ramson Girl Leili [[id number]]  None [1]   Angel, Bucklew [[id number]]  None [1]                                         Lacerations:     Chastelyn, Rennard Girl Dashanique [[id number]]  2nd degree [3];Periurethral [8];Perineal [11]   Neena, Delone [[id number]]  2nd degree [3];Periurethral [8];Perineal [11]  Patient  had delivery of a Viable infant.  Information for the patient's newborn:  Krystin, Rudden [[id number]]  Delivery Method: Vag-Spont Information for the patient's newborn:  Janissa, Kepford [[id number]]  Delivery Method: Kylla Lewkowicz, Girl Jazzlyne [[id number]]  05/27/2016   Briyelle, Delcour [[id number]]  05/27/2016  The patient was admitted for induction of labor because of twins at 38 weeks and 5 days. Her induction was uncomplicated. After the first infant was delivered vaginally, and external version was attempted. That was not successful. The patient consented to a vaginal breech delivery of the second infant. The patient had an uneventful breech extraction. Details of delivery can be found in separate delivery note.  Patient had a routine postpartum course. Patient is discharged home 05/29/16.   Physical Exam:  Vitals:   05/27/16 1852 05/28/16 0925 05/28/16 1848 05/29/16 0617  BP: 139/79 110/60 113/67 104/68  Pulse: 87 70 81 82  Resp: Temp: 97.8 F (36.6 C) 98.2 F (36.8 C) 98.3 F (36.8 C) 98.6 F (37 C)  TempSrc: Oral Oral Oral Oral  SpO2:      Weight:      Height:       General: alert and no distress Lochia: appropriate Uterine Fundus: firm Incision: N/A DVT Evaluation: No evidence of DVT seen on physical exam.  Labs: Lab Results  Component Value Date   WBC 14.8 (H) 05/28/2016   HGB 8.8 (L) 05/28/2016   HCT 25.3 (L) 05/28/2016   MCV 83.0 05/28/2016   PLT 176 05/28/2016   No flowsheet data found.  Discharge instruction: per After Visit Summary and "Baby and Me Booklet".  After Visit Meds:    Medication List    TAKE these medications   acetaminophen 325 MG tablet Commonly known as:  TYLENOL Take 650 mg by mouth every 6 (six) hours as needed for mild pain or headache.   ferrous sulfate 325 (65 FE) MG tablet Take 1 tablet (325 mg total) by mouth 3 (three) times daily with meals. What changed:  when to take this    fluticasone 50 MCG/ACT nasal spray Commonly known as:  FLONASE Place 2 sprays into both nostrils daily.   ibuprofen 800 MG tablet Commonly known as:  ADVIL,MOTRIN Take 1 tablet (800 mg total) by mouth every 8 (eight) hours as needed.   prenatal multivitamin Tabs tablet Take 1 tablet by mouth daily at 12 noon.       Diet: routine diet  Activity: Advance as tolerated. Pelvic rest for 6 weeks.   Outpatient follow up:6 weeks Follow up Appt:No future appointments. Follow up visit: No Follow-up on file.  Postpartum contraception: Vasectomy  05/29/2016 Janine Limbo, MD

## 2016-05-29 NOTE — Lactation Note (Signed)
This note was copied from a baby's chart. Lactation Consultation Note  Patient Name: Candice Bowman [id number] Date: 05/29/2016  Follow up visit before discharge.  Baby boy has been feeding well at breast and taking small amounts of formula when still acting hungry.  Baby Candice has been much sleepier at breast but will take formula supplement.  Mom has pumped a few times but not obtaining milk yet.  Discussed milk coming to volume.  Instructed to continue to feed babies with cues, post pump every 3 hours(mom has a spectra pump) and supplement with expressed milk and formula if still hungry.  Mom has handout for volume per day of life.  Pediatrician appointment tomorrow.  Outpatient lactation services/support information reviewed and encouraged.  Questions answered.   Maternal Data    Feeding Feeding Type: Breast Fed Length of feed: 10 min  LATCH Score/Interventions Latch: Repeated attempts needed to sustain latch, nipple held in mouth throughout feeding, stimulation needed to elicit sucking reflex. Intervention(s): Waking techniques Intervention(s): Adjust position;Assist with latch  Audible Swallowing: None  Type of Nipple: Everted at rest and after stimulation  Comfort (Breast/Nipple): Soft / non-tender     Hold (Positioning): Assistance needed to correctly position infant at breast and maintain latch.  LATCH Score: 6  Lactation Tools Discussed/Used     Consult Status      Huston Foley 05/29/2016, 9:05 AM

## 2016-06-24 ENCOUNTER — Encounter (HOSPITAL_COMMUNITY): Payer: Self-pay | Admitting: Emergency Medicine

## 2016-06-24 ENCOUNTER — Ambulatory Visit (HOSPITAL_COMMUNITY)
Admission: EM | Admit: 2016-06-24 | Discharge: 2016-06-24 | Disposition: A | Discharge status: Home / Self Care | Payer: BLUE CROSS/BLUE SHIELD | Attending: Emergency Medicine | Admitting: Emergency Medicine

## 2016-06-24 DIAGNOSIS — J029 Acute pharyngitis, unspecified: Secondary | ICD-10-CM | POA: Diagnosis not present

## 2016-06-24 DIAGNOSIS — L853 Xerosis cutis: Secondary | ICD-10-CM

## 2016-06-24 LAB — POCT RAPID STREP A: Streptococcus, Group A Screen (Direct): NEGATIVE

## 2016-06-24 NOTE — ED Provider Notes (Signed)
CSN: [id number]     Arrival date & time 06/24/16  1925 History   First MD Initiated Contact with Patient 06/24/16 2119     Chief Complaint  Patient presents with  . Sore Throat   (Consider location/radiation/quality/duration/timing/severity/associated sxs/prior Treatment) Patient is a 25 y.o female, presents today for 3-day history of sore throat at 7/10 with new onset of headache today. She also endorses running nose, sneezing and fatigue. She is negative for cough, shortness of breath, fever, chills, abdominal pain, N/V/D. She is concern for strep. She also complaints of skin itchiness all over x 1 week without a rash. No body else in family has rash or itchiness. She share a bed with her fiance and he is asymptomatic.       Past Medical History:  Diagnosis Date  . Anemia   . Chlamydia contact, treated   . Hx of varicella    Past Surgical History:  Procedure Laterality Date  . VAGINAL DELIVERY N/A 05/27/2016   Procedure: VAGINAL DELIVERY;  Surgeon: Jaymes GraffNaima Dillard, MD;  Location: [facility];  Service: Obstetrics;  Laterality: N/A;  . WISDOM TOOTH EXTRACTION     Family History  Problem Relation Age of Onset  . Schizophrenia Maternal Aunt   . Schizophrenia Maternal Uncle   . Alcohol abuse Neg Hx   . Arthritis Neg Hx   . Asthma Neg Hx   . Birth defects Neg Hx   . Cancer Neg Hx   . COPD Neg Hx   . Depression Neg Hx   . Diabetes Neg Hx   . Drug abuse Neg Hx   . Early death Neg Hx   . Hearing loss Neg Hx   . Heart disease Neg Hx   . Hyperlipidemia Neg Hx   . Hypertension Neg Hx   . Kidney disease Neg Hx   . Learning disabilities Neg Hx   . Mental retardation Neg Hx   . Miscarriages / Stillbirths Neg Hx   . Stroke Neg Hx   . Vision loss Neg Hx   . Varicose Veins Neg Hx    Social History  Substance Use Topics  . Smoking status: Never Smoker  . Smokeless tobacco: Never Used  . Alcohol use No   OB History    Gravida Para Term Preterm AB Living   1 1 1     2    SAB TAB Ectopic Multiple Live Births         1 2     Review of Systems  Constitutional: Positive for fatigue. Negative for chills and fever.       Stays tired  HENT: Positive for rhinorrhea, sneezing and sore throat. Negative for congestion, ear pain and sinus pressure.   Respiratory: Negative for cough, shortness of breath and wheezing.   Cardiovascular: Negative for chest pain and palpitations.  Gastrointestinal: Negative for abdominal pain, diarrhea, nausea and vomiting.  Genitourinary: Negative for dysuria, frequency and urgency.  Musculoskeletal: Positive for back pain.  Neurological: Positive for headaches. Negative for dizziness and weakness.    Allergies  Review of patient's allergies indicates no known allergies.  Home Medications   Prior to Admission medications   Medication Sig Start Date End Date Taking? Authorizing Provider  MULTIPLE VITAMIN PO Take by mouth.   Yes Historical Provider, MD  acetaminophen (TYLENOL) 325 MG tablet Take 650 mg by mouth every 6 (six) hours as needed for mild pain or headache.    Historical Provider, MD  ferrous sulfate 325 (65 FE)  MG tablet Take 1 tablet (325 mg total) by mouth 3 (three) times daily with meals. 05/29/16   Kirkland Hun, MD  fluticasone [facility]) 50 MCG/ACT nasal spray Place 2 sprays into both nostrils daily. Patient not taking: Reported on 05/06/2016 12/18/15   Domenick Gong, MD  ibuprofen (ADVIL,MOTRIN) 800 MG tablet Take 1 tablet (800 mg total) by mouth every 8 (eight) hours as needed. 05/29/16   Kirkland Hun, MD  Prenatal Vit-Fe Fumarate-FA (PRENATAL MULTIVITAMIN) TABS tablet Take 1 tablet by mouth daily at 12 noon.    Historical Provider, MD   Meds Ordered and Administered this Visit  Medications - No data to display  BP 121/88 (BP Location: Right Arm)   Pulse 84   Temp 98.5 F (36.9 C) (Oral)   Resp 18   SpO2 99%   Breastfeeding? Yes  No data found.   Physical Exam  Constitutional: She is oriented to person,  place, and time. She appears well-developed and well-nourished.  HENT:  Head: Normocephalic and atraumatic.  Right Ear: External ear normal.  Left Ear: External ear normal.  Nose: Nose normal.  Mouth/Throat: Oropharynx is clear and moist. No oropharyngeal exudate.  Ear canals clear, TM without erythema  Eyes: Conjunctivae are normal. Pupils are equal, round, and reactive to light.  Neck: Normal range of motion. Neck supple.  Cardiovascular: Normal rate, regular rhythm and normal heart sounds.   Pulmonary/Chest: Effort normal and breath sounds normal. She has no wheezes.  Abdominal: Soft. Bowel sounds are normal. There is no tenderness.  Lymphadenopathy:    She has no cervical adenopathy.  Neurological: She is alert and oriented to person, place, and time.  Skin:  Very dry skin noted on arms, and legs and upper back  Psychiatric: She has a normal mood and affect.  Nursing note and vitals reviewed.   Urgent Care Course   Clinical Course    Procedures (including critical care time)  Labs Review Labs Reviewed  POCT RAPID STREP A    Imaging Review No results found.   Visual Acuity Review  Right Eye Distance:   Left Eye Distance:   Bilateral Distance:    Right Eye Near:   Left Eye Near:    Bilateral Near:         MDM   1. Viral pharyngitis   2. Xerosis of skin    Rapid strep negative. Strep culture pending. Informed that we will call her if strep is positive. Patient educated to use salt water gargle, honey, throat lozenges, or butterscotch candy to help with sore throat. Patient also exhibits dry skin on majority of her body parts; this could be the cause of her itchiness. Instructed to use good and rich lotion/cream, use TID, apply after shower/bath when skin still moist. Doubt scabies. Instructed to follow up for her itchiness if she does not improve.    Lucia Estelle, NP 06/24/16 2218

## 2016-06-24 NOTE — Discharge Instructions (Signed)
Do salt water gargle, honey, throat lozenges, butterscotch candy for your sore throat. If none of these works, then you may try chloraseptic spray or lozenges.  We will call you if your strep culture is positive. Otherwise follow up if you do not improve.

## 2016-06-24 NOTE — ED Triage Notes (Signed)
[facility] a [facility], headache, and itching for 1 week. The patient stated that she has been itching for 1 week and the ST started 2 days ago.

## 2016-06-27 LAB — CULTURE, GROUP A STREP (THRC)

## 2016-08-28 ENCOUNTER — Telehealth (INDEPENDENT_AMBULATORY_CARE_PROVIDER_SITE_OTHER): Payer: Self-pay | Admitting: Orthopaedic Surgery

## 2016-08-28 NOTE — Telephone Encounter (Signed)
Left voice mail for patient to call me back to schedule surgery. 

## 2016-12-07 ENCOUNTER — Other Ambulatory Visit: Payer: Self-pay | Admitting: Surgery

## 2016-12-07 DIAGNOSIS — M6208 Separation of muscle (nontraumatic), other site: Secondary | ICD-10-CM

## 2016-12-23 ENCOUNTER — Other Ambulatory Visit: Payer: BLUE CROSS/BLUE SHIELD

## 2017-12-16 ENCOUNTER — Other Ambulatory Visit: Payer: Self-pay | Admitting: Obstetrics and Gynecology

## 2017-12-16 DIAGNOSIS — Z1239 Encounter for other screening for malignant neoplasm of breast: Secondary | ICD-10-CM

## 2017-12-22 ENCOUNTER — Ambulatory Visit
Admission: RE | Admit: 2017-12-22 | Discharge: 2017-12-22 | Disposition: A | Discharge status: Home / Self Care | Payer: 59 | Source: Ambulatory Visit | Attending: Obstetrics and Gynecology | Admitting: Obstetrics and Gynecology

## 2017-12-22 DIAGNOSIS — Z1239 Encounter for other screening for malignant neoplasm of breast: Secondary | ICD-10-CM

## 2018-10-07 ENCOUNTER — Encounter: Payer: Self-pay | Admitting: Certified Nurse Midwife

## 2018-11-02 ENCOUNTER — Encounter: Payer: Self-pay | Admitting: Certified Nurse Midwife

## 2018-11-09 ENCOUNTER — Other Ambulatory Visit (HOSPITAL_COMMUNITY)
Admission: RE | Admit: 2018-11-09 | Discharge: 2018-11-09 | Disposition: A | Discharge status: Home / Self Care | Payer: 59 | Source: Ambulatory Visit | Attending: Certified Nurse Midwife | Admitting: Certified Nurse Midwife

## 2018-11-09 ENCOUNTER — Encounter: Payer: Self-pay | Admitting: Certified Nurse Midwife

## 2018-11-09 ENCOUNTER — Other Ambulatory Visit: Payer: Self-pay

## 2018-11-09 ENCOUNTER — Ambulatory Visit (INDEPENDENT_AMBULATORY_CARE_PROVIDER_SITE_OTHER): Payer: PRIVATE HEALTH INSURANCE | Admitting: Certified Nurse Midwife

## 2018-11-09 VITALS — BP 102/64 | HR 68 | Resp 16 | Ht 64.5 in | Wt 143.0 lb

## 2018-11-09 DIAGNOSIS — Z0000 Encounter for general adult medical examination without abnormal findings: Secondary | ICD-10-CM | POA: Diagnosis not present

## 2018-11-09 DIAGNOSIS — Z124 Encounter for screening for malignant neoplasm of cervix: Secondary | ICD-10-CM | POA: Insufficient documentation

## 2018-11-09 DIAGNOSIS — Z113 Encounter for screening for infections with a predominantly sexual mode of transmission: Secondary | ICD-10-CM | POA: Diagnosis not present

## 2018-11-09 DIAGNOSIS — Z01411 Encounter for gynecological examination (general) (routine) with abnormal findings: Secondary | ICD-10-CM

## 2018-11-09 DIAGNOSIS — N6310 Unspecified lump in the right breast, unspecified quadrant: Secondary | ICD-10-CM

## 2018-11-09 LAB — POCT URINALYSIS DIPSTICK
Bilirubin, UA: NEGATIVE
Blood, UA: NEGATIVE
Glucose, UA: NEGATIVE
Ketones, UA: NEGATIVE
Leukocytes, UA: NEGATIVE
NITRITE UA: NEGATIVE
PH UA: 5 (ref 5.0–8.0)
PROTEIN UA: NEGATIVE
UROBILINOGEN UA: NEGATIVE U/dL — AB

## 2018-11-09 NOTE — Progress Notes (Signed)
Patient scheduled while in office for right breast ultrasound at [facility] on 11/23/2018 at 12:30pm, arriving at 12:10pm. Patient is agreeable to date and time.

## 2018-11-09 NOTE — Progress Notes (Signed)
28 y.o. G1P1002 Single  African American Fe here to establish gyn care and for annual exam. Last exam 2017 with birth of her twins. Periods normal over the past year, no cramping, duration 4 days,one heavy day. No issues with periods. Contraception natural family planning, working with calendar timing. No partner change for 7 years. Desires STD screening, no symptoms. Has noted small ? Cyst in breast again, had previously in past, but did not notice again until she stopped breastfeeding. Was seen for US at Breast center in past for follow up. Denies pain or tenderness, noted on breast exam, no nipple discharge or skin change. No other health issues today.  Patient's last menstrual period was 10/31/2018.          Sexually active: Yes.    The current method of family planning is none.    Exercising: No.  exercise busy with children Smoker:  no  Review of Systems  Constitutional: Negative.   HENT: Negative.   Eyes: Negative.   Respiratory: Negative.   Cardiovascular: Negative.   Gastrointestinal: Negative.   Genitourinary:       Pelvic pressure  Musculoskeletal: Negative.   Skin: Negative.   Neurological: Negative.   Endo/Heme/Allergies: Negative.   Psychiatric/Behavioral: Negative.     Health Maintenance: Pap:  2017 neg per patient History of Abnormal Pap: no MMG:  none Self Breast exams: yes Colonoscopy:  none BMD:   none TDaP:  2014 Shingles: no Pneumonia: no Hep C and HIV: HIV neg 2017 Labs: if needed   reports that she has never smoked. She has never used smokeless tobacco. She reports that she does not drink alcohol or use drugs.  Past Medical History:  Diagnosis Date  . Anemia   . Chlamydia contact, treated   . Hx of varicella     Past Surgical History:  Procedure Laterality Date  . VAGINAL DELIVERY N/A 05/27/2016   Procedure: VAGINAL DELIVERY;  Surgeon: Jaymes GraffNaima Dillard, MD;  Location: [facility];  Service: Obstetrics;  Laterality: N/A;  . WISDOM TOOTH  EXTRACTION      No current outpatient medications on file.   No current facility-administered medications for this visit.     Family History  Problem Relation Age of Onset  . Schizophrenia Maternal Aunt   . Schizophrenia Maternal Uncle   . Alcohol abuse Neg Hx   . Arthritis Neg Hx   . Asthma Neg Hx   . Birth defects Neg Hx   . Cancer Neg Hx   . COPD Neg Hx   . Depression Neg Hx   . Diabetes Neg Hx   . Drug abuse Neg Hx   . Early death Neg Hx   . Hearing loss Neg Hx   . Heart disease Neg Hx   . Hyperlipidemia Neg Hx   . Hypertension Neg Hx   . Kidney disease Neg Hx   . Learning disabilities Neg Hx   . Mental retardation Neg Hx   . Miscarriages / Stillbirths Neg Hx   . Stroke Neg Hx   . Vision loss Neg Hx   . Varicose Veins Neg Hx     ROS:  Pertinent items are noted in HPI.  Otherwise, a comprehensive ROS was negative.  Exam:   BP 102/64   Pulse 68   Resp 16   Ht 5' 4.5" (1.638 m)   Wt 143 lb (64.9 kg)   LMP 10/31/2018   BMI 24.17 kg/m  Height: 5' 4.5" (163.8 cm) Ht Readings from Last  3 Encounters:  11/09/18 5' 4.5" (1.638 m)  05/27/16 5' 5.5" (1.664 m)  05/18/16 5' 5.5" (1.664 m)    General appearance: alert, cooperative and appears stated age Head: Normocephalic, without obvious abnormality, atraumatic Neck: no adenopathy, supple, symmetrical, trachea midline and thyroid normal to inspection and palpation Lungs: clear to auscultation bilaterally Breasts: normal appearance, no masses or tenderness, No nipple retraction or dimpling, No nipple discharge or bleeding, No axillary or supraclavicular adenopathy, bb size mass noted in right breast at 9 o'clock at 1-2 fb from outer edge ? cyst, previous breastfeeding Heart: regular rate and rhythm Abdomen: soft, non-tender; no masses,  no organomegaly Extremities: extremities normal, atraumatic, no cyanosis or edema Skin: Skin color, texture, turgor normal. No rashes or lesions Lymph nodes: Cervical,  supraclavicular, and axillary nodes normal. No abnormal inguinal nodes palpated Neurologic: Grossly normal   Pelvic: External genitalia:  no lesions              Urethra:  normal appearing urethra with no masses, tenderness or lesions              Bartholin's and Skene's: normal                 Vagina: normal appearing vagina with normal color and discharge, no lesions              Cervix: multiparous appearance, no bleeding following Pap, no cervical motion tenderness and no lesions              Pap taken: Yes.   Bimanual Exam:  Uterus:  normal size, contour, position, consistency, mobility, non-tender and anteverted              Adnexa: normal adnexa and no mass, fullness, tenderness               Rectovaginal: Confirms               Anus:  normal sphincter tone, no lesions  Chaperone present: yes  A:  Well Woman with normal exam  Contraception NFP  Right breast mass  STD screening  History of chlamydia treated  P:   Reviewed health and wellness pertinent to exam  Discussed using phone app to help with timing safe days for sexual activity. Patient aware.  Discussed noted right breast mass finding, possible cyst or calcification from breastfeeding. Will schedule with [facility] center for [place] for evaluation prior to leaving today.   Lab:STD panel, Hep C, Gc/chlamydia, Affirm  Pap smear: yes   counseled on breast self exam, STD prevention, HIV risk factors and prevention, feminine hygiene, adequate intake of calcium and vitamin D, diet and exercise  return annually or prn  An After Visit Summary was printed and given to the patient.

## 2018-11-09 NOTE — Patient Instructions (Signed)
General topics  Next pap or exam is  due in 1 year Take a Women's multivitamin Take 1200 mg. of calcium daily - prefer dietary If any concerns in interim to call back  Breast Self-Awareness Practicing breast self-awareness may pick up problems early, prevent significant medical complications, and possibly save your life. By practicing breast self-awareness, you can become familiar with how your breasts look and feel and if your breasts are changing. This allows you to notice changes early. It can also offer you some reassurance that your breast health is good. One way to learn what is normal for your breasts and whether your breasts are changing is to do a breast self-exam. If you find a lump or something that was not present in the past, it is best to contact your caregiver right away. Other findings that should be evaluated by your caregiver include nipple discharge, especially if it is bloody; skin changes or reddening; areas where the skin seems to be pulled in (retracted); or new lumps and bumps. Breast pain is seldom associated with cancer (malignancy), but should also be evaluated by a caregiver. BREAST SELF-EXAM The best time to examine your breasts is 5 7 days after your menstrual period is over.  ExitCare Patient Information 2013 [employer].   Exercise to Stay Healthy Exercise helps you become and stay healthy. EXERCISE IDEAS AND TIPS Choose exercises that:  You enjoy.  Fit into your day. You do not need to exercise really hard to be healthy. You can do exercises at a slow or medium level and stay healthy. You can:  Stretch before and after working out.  Try yoga, Pilates, or tai chi.  Lift weights.  Walk fast, swim, jog, run, climb stairs, bicycle, dance, or rollerskate.  Take aerobic classes. Exercises that burn about 150 calories:  Running 1  miles in 15 minutes.  Playing volleyball for 45 to 60 minutes.  Washing and waxing a car for 45 to 60  minutes.  Playing touch football for 45 minutes.  Walking 1  miles in 35 minutes.  Pushing a stroller 1  miles in 30 minutes.  Playing basketball for 30 minutes.  Raking leaves for 30 minutes.  Bicycling 5 miles in 30 minutes.  Walking 2 miles in 30 minutes.  Dancing for 30 minutes.  Shoveling snow for 15 minutes.  Swimming laps for 20 minutes.  Walking up stairs for 15 minutes.  Bicycling 4 miles in 15 minutes.  Gardening for 30 to 45 minutes.  Jumping rope for 15 minutes.  Washing windows or floors for 45 to 60 minutes. Document Released: 11/14/2010 Document Revised: 01/04/2012 Document Reviewed: 11/14/2010 ExitCare Patient Information 2013 [employer].   Other topics ( that may be useful information):    Sexually Transmitted Disease Sexually transmitted disease (STD) refers to any infection that is passed from person to person during sexual activity. This may happen by way of saliva, semen, blood, vaginal mucus, or urine. Common STDs include:  Gonorrhea.  Chlamydia.  Syphilis.  HIV/AIDS.  Genital herpes.  Hepatitis B and C.  Trichomonas.  Human papillomavirus (HPV).  Pubic lice. CAUSES  An STD may be spread by bacteria, virus, or parasite. A person can get an STD by:  Sexual intercourse with an infected person.  Sharing sex toys with an infected person.  Sharing needles with an infected person.  Having intimate contact with the genitals, mouth, or rectal areas of an infected person. SYMPTOMS  Some people may not have any symptoms, but   they can still pass the infection to others. Different STDs have different symptoms. Symptoms include:  Painful or bloody urination.  Pain in the pelvis, abdomen, vagina, anus, throat, or eyes.  Skin rash, itching, irritation, growths, or sores (lesions). These usually occur in the genital or anal area.  Abnormal vaginal discharge.  Penile discharge in men.  Soft, flesh-colored skin growths in the  genital or anal area.  Fever.  Pain or bleeding during sexual intercourse.  Swollen glands in the groin area.  Yellow skin and eyes (jaundice). This is seen with hepatitis. DIAGNOSIS  To make a diagnosis, your caregiver may:  Take a medical history.  Perform a physical exam.  Take a specimen (culture) to be examined.  Examine a sample of discharge under a microscope.  Perform blood test TREATMENT   Chlamydia, gonorrhea, trichomonas, and syphilis can be cured with antibiotic medicine.  Genital herpes, hepatitis, and HIV can be treated, but not cured, with prescribed medicines. The medicines will lessen the symptoms.  Genital warts from HPV can be treated with medicine or by freezing, burning (electrocautery), or surgery. Warts may come back.  HPV is a virus and cannot be cured with medicine or surgery.However, abnormal areas may be followed very closely by your caregiver and may be removed from the cervix, vagina, or vulva through office procedures or surgery. If your diagnosis is confirmed, your recent sexual partners need treatment. This is true even if they are symptom-free or have a negative culture or evaluation. They should not have sex until their caregiver says it is okay. HOME CARE INSTRUCTIONS  All sexual partners should be informed, tested, and treated for all STDs.  Take your antibiotics as directed. Finish them even if you start to feel better.  Only take over-the-counter or prescription medicines for pain, discomfort, or fever as directed by your caregiver.  Rest.  Eat a balanced diet and drink enough fluids to keep your urine clear or pale yellow.  Do not have sex until treatment is completed and you have followed up with your caregiver. STDs should be checked after treatment.  Keep all follow-up appointments, Pap tests, and blood tests as directed by your caregiver.  Only use latex condoms and water-soluble lubricants during sexual activity. Do not use  petroleum jelly or oils.  Avoid alcohol and illegal drugs.  Get vaccinated for HPV and hepatitis. If you have not received these vaccines in the past, talk to your caregiver about whether one or both might be right for you.  Avoid risky sex practices that can break the skin. The only way to avoid getting an STD is to avoid all sexual activity.Latex condoms and dental dams (for oral sex) will help lessen the risk of getting an STD, but will not completely eliminate the risk. SEEK MEDICAL CARE IF:   You have a fever.  You have any new or worsening symptoms. Document Released: 01/02/2003 Document Revised: 01/04/2012 Document Reviewed: 01/09/2011 [facility] Patient Information 2013 [employer].    Domestic Abuse You are being battered or abused if someone close to you hits, pushes, or physically hurts you in any way. You also are being abused if you are forced into activities. You are being sexually abused if you are forced to have sexual contact of any kind. You are being emotionally abused if you are made to feel worthless or if you are constantly threatened. It is important to remember that help is available. No one has the right to abuse you. PREVENTION OF FURTHER  ABUSE  Learn the warning signs of danger. This varies with situations but may include: the use of alcohol, threats, isolation from friends and family, or forced sexual contact. Leave if you feel that violence is going to occur.  If you are attacked or beaten, report it to the police so the abuse is documented. You do not have to press charges. The police can protect you while you or the attackers are leaving. Get the officer's name and badge number and a copy of the report.  Find someone you can trust and tell them what is happening to you: your caregiver, a nurse, clergy member, close friend or family member. Feeling ashamed is natural, but remember that you have done nothing wrong. No one deserves abuse. Document Released:  10/09/2000 Document Revised: 01/04/2012 Document Reviewed: 12/18/2010 ExitCare Patient Information 2013 [employer].    How Much is Too Much Alcohol? Drinking too much alcohol can cause injury, accidents, and health problems. These types of problems can include:   Car crashes.  Falls.  Family fighting (domestic violence).  Drowning.  Fights.  Injuries.  Burns.  Damage to certain organs.  Having a baby with birth defects. ONE DRINK CAN BE TOO MUCH WHEN YOU ARE:  Working.  Pregnant or breastfeeding.  Taking medicines. Ask your doctor.  Driving or planning to drive. If you or someone you know has a drinking problem, get help from a doctor.  Document Released: 08/08/2009 Document Revised: 01/04/2012 Document Reviewed: 08/08/2009 ExitCare Patient Information 2013 [employer].   Smoking Hazards Smoking cigarettes is extremely bad for your health. Tobacco smoke has over 200 known poisons in it. There are over 60 chemicals in tobacco smoke that cause cancer. Some of the chemicals found in cigarette smoke include:   Cyanide.  Benzene.  Formaldehyde.  Methanol (wood alcohol).  Acetylene (fuel used in welding torches).  Ammonia. Cigarette smoke also contains the poisonous gases nitrogen oxide and carbon monoxide.  Cigarette smokers have an increased risk of many serious medical problems and Smoking causes approximately:  90% of all lung cancer deaths in men.  80% of all lung cancer deaths in women.  90% of deaths from chronic obstructive lung disease. Compared with nonsmokers, smoking increases the risk of:  Coronary heart disease by 2 to 4 times.  Stroke by 2 to 4 times.  Men developing lung cancer by 23 times.  Women developing lung cancer by 13 times.  Dying from chronic obstructive lung diseases by 12 times.  . Smoking is the most preventable cause of death and disease in our society.  WHY IS SMOKING ADDICTIVE?  Nicotine is the chemical  agent in tobacco that is capable of causing addiction or dependence.  When you smoke and inhale, nicotine is absorbed rapidly into the bloodstream through your lungs. Nicotine absorbed through the lungs is capable of creating a powerful addiction. Both inhaled and non-inhaled nicotine may be addictive.  Addiction studies of cigarettes and spit tobacco show that addiction to nicotine occurs mainly during the teen years, when young people begin using tobacco products. WHAT ARE THE BENEFITS OF QUITTING?  There are many health benefits to quitting smoking.   Likelihood of developing cancer and heart disease decreases. Health improvements are seen almost immediately.  Blood pressure, pulse rate, and breathing patterns start returning to normal soon after quitting. QUITTING SMOKING   American Lung Association - 1-800-LUNGUSA  American Cancer Society - 1-800-ACS-2345 Document Released: 11/19/2004 Document Revised: 01/04/2012 Document Reviewed: 07/24/2009 ExitCare Patient Information 2013 ExitCare,   LLC.   Stress Management Stress is a state of physical or mental tension that often results from changes in your life or normal routine. Some common causes of stress are:  Death of a loved one.  Injuries or severe illnesses.  Getting fired or changing jobs.  Moving into a new home. Other causes may be:  Sexual problems.  Business or financial losses.  Taking on a large debt.  Regular conflict with someone at home or at work.  Constant tiredness from lack of sleep. It is not just bad things that are stressful. It may be stressful to:  Win the lottery.  Get married.  Buy a new car. The amount of stress that can be easily tolerated varies from person to person. Changes generally cause stress, regardless of the types of change. Too much stress can affect your health. It may lead to physical or emotional problems. Too little stress (boredom) may also become stressful. SUGGESTIONS TO  REDUCE STRESS:  Talk things over with your family and friends. It often is helpful to share your concerns and worries. If you feel your problem is serious, you may want to get help from a professional counselor.  Consider your problems one at a time instead of lumping them all together. Trying to take care of everything at once may seem impossible. List all the things you need to do and then start with the most important one. Set a goal to accomplish 2 or 3 things each day. If you expect to do too many in a single day you will naturally fail, causing you to feel even more stressed.  Do not use alcohol or drugs to relieve stress. Although you may feel better for a short time, they do not remove the problems that caused the stress. They can also be habit forming.  Exercise regularly - at least 3 times per week. Physical exercise can help to relieve that "uptight" feeling and will relax you.  The shortest distance between despair and hope is often a good night's sleep.  Go to bed and get up on time allowing yourself time for appointments without being rushed.  Take a short "time-out" period from any stressful situation that occurs during the day. Close your eyes and take some deep breaths. Starting with the muscles in your face, tense them, hold it for a few seconds, then relax. Repeat this with the muscles in your neck, shoulders, hand, stomach, back and legs.  Take good care of yourself. Eat a balanced diet and get plenty of rest.  Schedule time for having fun. Take a break from your daily routine to relax. HOME CARE INSTRUCTIONS   Call if you feel overwhelmed by your problems and feel you can no longer manage them on your own.  Return immediately if you feel like hurting yourself or someone else. Document Released: 04/07/2001 Document Revised: 01/04/2012 Document Reviewed: 11/28/2007 ExitCare Patient Information 2013 ExitCare[employer].   

## 2018-11-10 LAB — HEPATITIS C ANTIBODY

## 2018-11-10 LAB — VAGINITIS/VAGINOSIS, DNA PROBE
CANDIDA SPECIES: NEGATIVE
Gardnerella vaginalis: NEGATIVE
Trichomonas vaginosis: NEGATIVE

## 2018-11-10 LAB — HEP, RPR, HIV PANEL
HEP B S AG: NEGATIVE
HIV SCREEN 4TH GENERATION: NONREACTIVE
RPR: NONREACTIVE

## 2018-11-11 LAB — GC/CHLAMYDIA PROBE AMP
CHLAMYDIA, DNA PROBE: NEGATIVE
NEISSERIA GONORRHOEAE BY PCR: NEGATIVE

## 2018-11-14 LAB — CYTOLOGY - PAP
DIAGNOSIS: NEGATIVE
HPV: NOT DETECTED

## 2018-11-23 ENCOUNTER — Ambulatory Visit
Admission: RE | Admit: 2018-11-23 | Discharge: 2018-11-23 | Disposition: A | Discharge status: Home / Self Care | Payer: 59 | Source: Ambulatory Visit | Attending: Certified Nurse Midwife | Admitting: Certified Nurse Midwife

## 2018-11-23 DIAGNOSIS — N6310 Unspecified lump in the right breast, unspecified quadrant: Secondary | ICD-10-CM

## 2018-11-25 NOTE — Progress Notes (Signed)
Dr. Hyacinth Meeker, okay to remove from hold?

## 2019-10-02 DIAGNOSIS — J019 Acute sinusitis, unspecified: Secondary | ICD-10-CM | POA: Insufficient documentation

## 2019-10-03 ENCOUNTER — Emergency Department (HOSPITAL_COMMUNITY)
Admission: EM | Admit: 2019-10-03 | Discharge: 2019-10-03 | Disposition: A | Discharge status: Home / Self Care | Payer: 59 | Attending: Emergency Medicine | Admitting: Emergency Medicine

## 2019-10-03 ENCOUNTER — Encounter (HOSPITAL_COMMUNITY): Payer: Self-pay

## 2019-10-03 ENCOUNTER — Other Ambulatory Visit: Payer: Self-pay

## 2019-10-03 ENCOUNTER — Emergency Department (HOSPITAL_COMMUNITY): Payer: 59

## 2019-10-03 DIAGNOSIS — R519 Headache, unspecified: Secondary | ICD-10-CM | POA: Insufficient documentation

## 2019-10-03 DIAGNOSIS — R739 Hyperglycemia, unspecified: Secondary | ICD-10-CM | POA: Insufficient documentation

## 2019-10-03 DIAGNOSIS — H9201 Otalgia, right ear: Secondary | ICD-10-CM | POA: Insufficient documentation

## 2019-10-03 DIAGNOSIS — R202 Paresthesia of skin: Secondary | ICD-10-CM | POA: Diagnosis not present

## 2019-10-03 LAB — CBC
HCT: 36.3 % (ref 36.0–46.0)
Hemoglobin: 12 g/dL (ref 12.0–15.0)
MCH: 29.3 pg (ref 26.0–34.0)
MCHC: 33.1 g/dL (ref 30.0–36.0)
MCV: 88.8 fL (ref 80.0–100.0)
Platelets: 271 10*3/uL (ref 150–400)
RBC: 4.09 MIL/uL (ref 3.87–5.11)
RDW: 13.8 % (ref 11.5–15.5)
WBC: 5.4 10*3/uL (ref 4.0–10.5)
nRBC: 0 % (ref 0.0–0.2)

## 2019-10-03 LAB — DIFFERENTIAL
Abs Immature Granulocytes: 0.02 10*3/uL (ref 0.00–0.07)
Basophils Absolute: 0.1 10*3/uL (ref 0.0–0.1)
Basophils Relative: 1 %
Eosinophils Absolute: 0.1 10*3/uL (ref 0.0–0.5)
Eosinophils Relative: 1 %
Immature Granulocytes: 0 %
Lymphocytes Relative: 20 %
Lymphs Abs: 1.1 10*3/uL (ref 0.7–4.0)
Monocytes Absolute: 0.3 10*3/uL (ref 0.1–1.0)
Monocytes Relative: 5 %
Neutro Abs: 3.9 10*3/uL (ref 1.7–7.7)
Neutrophils Relative %: 73 %

## 2019-10-03 LAB — COMPREHENSIVE METABOLIC PANEL
ALT: 22 U/L (ref 0–44)
AST: 16 U/L (ref 15–41)
Albumin: 4.1 g/dL (ref 3.5–5.0)
Alkaline Phosphatase: 63 U/L (ref 38–126)
Anion gap: 9 (ref 5–15)
BUN: 9 mg/dL (ref 6–20)
CO2: 23 mmol/L (ref 22–32)
Calcium: 8.9 mg/dL (ref 8.9–10.3)
Chloride: 106 mmol/L (ref 98–111)
Creatinine, Ser: 0.75 mg/dL (ref 0.44–1.00)
GFR calc Af Amer: >60 mL/min (ref >60)
GFR calc non Af Amer: >60 mL/min (ref >60)
Glucose, Bld: 103 mg/dL — ABNORMAL HIGH (ref 70–99)
Potassium: 3.6 mmol/L (ref 3.5–5.1)
Sodium: 138 mmol/L (ref 135–145)
Total Bilirubin: 0.8 mg/dL (ref 0.3–1.2)
Total Protein: 7.3 g/dL (ref 6.5–8.1)

## 2019-10-03 LAB — I-STAT CHEM 8, ED
BUN: 8 mg/dL (ref 6–20)
Calcium, Ion: 1.17 mmol/L (ref 1.15–1.40)
Chloride: 105 mmol/L (ref 98–111)
Creatinine, Ser: 0.7 mg/dL (ref 0.44–1.00)
Glucose, Bld: 98 mg/dL (ref 70–99)
HCT: 36 % (ref 36.0–46.0)
Hemoglobin: 12.2 g/dL (ref 12.0–15.0)
Potassium: 3.6 mmol/L (ref 3.5–5.1)
Sodium: 140 mmol/L (ref 135–145)
TCO2: 25 mmol/L (ref 22–32)

## 2019-10-03 LAB — I-STAT BETA HCG BLOOD, ED (MC, WL, AP ONLY): I-stat hCG, quantitative: <5 m[IU]/mL (ref <5)

## 2019-10-03 LAB — APTT: aPTT: 33 seconds (ref 24–36)

## 2019-10-03 LAB — PROTIME-INR
INR: 1.1 (ref 0.8–1.2)
Prothrombin Time: 14.2 seconds (ref 11.4–15.2)

## 2019-10-03 MED ORDER — SODIUM CHLORIDE 0.9% FLUSH
3.0000 mL | Freq: Once | INTRAVENOUS | Status: DC
Start: 2019-10-03 — End: 2019-10-03

## 2019-10-03 NOTE — Discharge Instructions (Addendum)
As discussed, your labs and head CT were normal for any acute findings. 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 Take Augmentin as prescribed by Dr. Janace Hoard for sinusitis. I have placed a referral for a neurology. Expect a call this week to schedule an appointment. If you do not hear from them by next week, I have provided you with the phone number to call. Return to the ER for new or worsening symptoms.

## 2019-10-03 NOTE — ED Provider Notes (Signed)
[facility] EMERGENCY DEPARTMENT Provider Note   CSN: [id number] Arrival date & time: 10/03/19  0848     History   Chief Complaint No chief complaint on file.   HPI Candice Bowman is a 28 y.o. female with a past medical history significant for anemia, anxiety, and depression who presents to the ED for an evaluation of worsening right ear pressure and right-sided numbness and tingling that started on Sunday.  Patient was seen by Dr. Jearld Fenton with ENT yesterday due to right eye and facial pain and was diagnosed with sinusitis. Patient has not taken any of her medications yet.  Patient notes full body numbness and tingling which started suddenly on Sunday and has progressively gotten worse.  Patient notes that her right side is worse than her left and associated with right hand tremor.  Patient notes frequent frontal headaches currently denies one at this time.  Right-sided ear pressure is associated with muffled hearing. Patient denies alleviating or aggravating factors. Patient denies changes to vision, dizziness, abdominal pain, chest pain, and shortness of breath. Patient denies rash.   Past Medical History:  Diagnosis Date   Anemia    Anxiety    Chlamydia contact, treated    Depression    Hx of varicella     Patient Active Problem List   Diagnosis Date Noted   Vaginal delivery--twins 05/28/2016   Twins--di/di 05/27/2016    Past Surgical History:  Procedure Laterality Date   VAGINAL DELIVERY N/A 05/27/2016   Procedure: VAGINAL DELIVERY;  Surgeon: Jaymes Graff, MD;  Location: [facility];  Service: Obstetrics;  Laterality: N/A;   WISDOM TOOTH EXTRACTION       OB History    Gravida  1   Para  1   Term  1   Preterm      AB      Living  2     SAB      TAB      Ectopic      Multiple  1   Live Births  2            Home Medications    Prior to Admission medications   Not on File    Family History Family History   Problem Relation Age of Onset   Schizophrenia Maternal Aunt    Schizophrenia Maternal Uncle    Fibroids Mother    Hypertension Maternal Grandmother    Hypertension Maternal Grandfather    Stroke Maternal Grandfather    Diabetes Paternal Grandmother    Fibroids Maternal Aunt     Social History Social History   Tobacco Use   Smoking status: Never Smoker   Smokeless tobacco: Never Used  Substance Use Topics   Alcohol use: No   Drug use: No     Allergies   Patient has no known allergies.   Review of Systems Review of Systems  Constitutional: Negative for chills and fever.  HENT: Positive for ear pain and sinus pressure. Negative for ear discharge and sore throat.   Eyes: Negative for visual disturbance.  Respiratory: Negative for shortness of breath.   Cardiovascular: Negative for chest pain.  Neurological: Positive for tremors, numbness and headaches. Negative for dizziness, facial asymmetry, speech difficulty and weakness.  All other systems reviewed and are negative.    Physical Exam Updated Vital Signs BP 104/67 (BP Location: Left Arm)    Pulse 76    Temp 98.9 F (37.2 C) (Oral)    Resp 12  SpO2 100%   Physical Exam Vitals signs and nursing note reviewed.  Constitutional:      General: She is not in acute distress.    Appearance: She is not ill-appearing.  HENT:     Head: Normocephalic.     Right Ear: Tympanic membrane, ear canal and external ear normal.     Left Ear: Tympanic membrane, ear canal and external ear normal.     Ears:     Comments: No mastoid tenderness.    Nose: Congestion present.     Comments: No tenderness to palpation over sinuses    Mouth/Throat:     Mouth: Mucous membranes are moist.     Pharynx: Oropharynx is clear. No oropharyngeal exudate or posterior oropharyngeal erythema.  Eyes:     Extraocular Movements: Extraocular movements intact.     Conjunctiva/sclera: Conjunctivae normal.     Pupils: Pupils are equal,  round, and reactive to light.  Neck:     Musculoskeletal: Neck supple.  Cardiovascular:     Rate and Rhythm: Normal rate and regular rhythm.     Pulses: Normal pulses.     Heart sounds: Normal heart sounds. No murmur. No friction rub. No gallop.   Pulmonary:     Effort: Pulmonary effort is normal.     Breath sounds: Normal breath sounds.  Abdominal:     General: Abdomen is flat. Bowel sounds are normal. There is no distension.     Palpations: Abdomen is soft.  Musculoskeletal:     Comments: Able to move all 4 extremities without difficulty.  Skin:    General: Skin is warm and dry.  Neurological:     General: No focal deficit present.     Mental Status: She is alert.     Comments: Speech is clear, able to follow commands CN III-XII intact Normal strength in upper and lower extremities bilaterally including dorsiflexion and plantar flexion, strong and equal grip strength Decreased sensation on right side when compared to left side Moves extremities without ataxia, coordination intact No pronator drift Ambulates without difficulty       ED Treatments / Results  Labs (all labs ordered are listed, but only abnormal results are displayed) Labs Reviewed  COMPREHENSIVE METABOLIC PANEL - Abnormal; Notable for the following components:      Result Value   Glucose, Bld 103 (*)    All other components within normal limits  PROTIME-INR  APTT  CBC  DIFFERENTIAL  I-STAT CHEM 8, ED  CBG MONITORING, ED  I-STAT BETA HCG BLOOD, ED ([facility], [facility], [facility] ONLY)    EKG None  Radiology Ct Head Wo Contrast  Result Date: 10/03/2019 CLINICAL DATA:  Focal neurological deficit for 6 hours, suspected stroke, RIGHT-sided numbness with frontal pain, urine facial pressure, symptoms since Sunday EXAM: CT HEAD WITHOUT CONTRAST TECHNIQUE: Contiguous axial images were obtained from the base of the skull through the vertex without intravenous contrast. Sagittal and coronal MPR images reconstructed from axial  data set. COMPARISON:  None FINDINGS: Brain: Diffuse dilatation of the lateral ventricles. Absence of the septum pellucidum. Decompressed third and fourth ventricles. No midline shift or mass effect. Normal appearance of brain parenchyma otherwise seen. No intracranial hemorrhage, mass lesion, or evidence of acute infarction. No extra-axial fluid collections. 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 No definite acute intracranial abnormalities. Electronically Signed   By: Lavonia Dana M.D.   On: 10/03/2019 10:38  Procedures Procedures (including critical care time)  Medications Ordered in ED Medications  sodium chloride flush (NS) 0.9 % injection 3 mL (has no administration in time range)     Initial Impression / Assessment and Plan / ED Course  I have reviewed the triage vital signs and the nursing notes.  Pertinent labs & imaging results that were available during my care of the patient were reviewed by me and considered in my medical decision making (see chart for details).  Clinical Course as of Oct 03 1707  Tue Oct 03, 2019  1321 Spoke to Dr. Otelia LimesLindzen with neurology who recommends outpatient follow-up for numbness/tingling.    [CC]    Clinical Course User Index [CC] Renee HarderCheek, Austin Pongratz B, PA-C      28 year old female presents to the ED due to bilateral numbness/tingling worse on the right side. Patient also notes ear pressure and facial pressure. Patient was evaluated by Dr. Jearld FentonByers yesterday with ENT and was prescribed Augmentin for sinusitis which patient has not started yet. Patient is afebrile, not tachycardic, or hypoxic. Patient in no acute distress and rather well appearing. Full neurological exam reassuring with mild decreased sensation in upper/lower extremities on the right side, but otherwise unremarkable. Strength equal on both sides. No nystagmus. Normal speech  with no facial droop. Doubt CVA at this time. TMs normal bilaterally with no signs of infection, doubt otitis media or externa. Triage ordered routine labs and head CT. Suspect paresthesias could be related to anxiety, multiple sclerosis, electrolyte abnormalities, and/or vitamin deficiencies.  CBC unremarkable with no leukocytosis. CMP reassuring with very minimal hyperglycemia at 103, but otherwise unremarkable. Doubt diabetes could be causing paresthesia at this time. Normal renal function. No electrolyte derangements. INR and APTT normal. Head CT personally reviewed which is negative for acute findings, but significant for absence of septum pellucidum. EKG personally reviewed which demonstrates sinus rhythm and no signs of ischemia, doubt arrhythmia causing TIA/CVA.  Spoke to neurology about further evaluation. See note above. Will discharge patient with ambulatory neurology referral. Patient advised to take Augmentin for sinusitis that was diagnosed by Dr. Jearld FentonByers yesterday. Strict ED precautions discussed with patient. Patient states understanding and agrees to plan. Patient discharged home in no acute distress and stable vitals  Discussed case with Dr. Rush Landmarkegeler who agrees with assessment and plan.   Final Clinical Impressions(s) / ED Diagnoses   Final diagnoses:  Numbness and tingling  Right ear pain    ED Discharge Orders         Ordered    Ambulatory referral to Neurology    Comments: An appointment is requested in approximately: as soon as possible   10/03/19 [address]1402           Cheek, Tavin Vernet B, PA-C 10/03/19 1709    Tegeler, Canary Brimhristopher J, MD 10/03/19 [phone]

## 2019-10-03 NOTE — ED Notes (Signed)
Pt stated she is going to wait outside

## 2019-10-03 NOTE — ED Triage Notes (Signed)
Patient complains of right sided numbness with frontal pain and ear pressure since Sunday. Ambulatory to triage with no difficulty on arrival. Speech clear, alert and orinted

## 2019-10-05 ENCOUNTER — Telehealth: Payer: Self-pay | Admitting: *Deleted

## 2019-10-05 ENCOUNTER — Ambulatory Visit: Payer: PRIVATE HEALTH INSURANCE | Admitting: Neurology

## 2019-10-05 NOTE — Telephone Encounter (Signed)
The patient arrived to her appt on time today.  However, she was tested for COVID-19 yesterday and the test results are still pending.  She was rescheduled by the front desk.

## 2019-11-15 ENCOUNTER — Other Ambulatory Visit: Payer: Self-pay

## 2019-11-15 ENCOUNTER — Encounter: Payer: Self-pay | Admitting: Certified Nurse Midwife

## 2019-11-15 ENCOUNTER — Ambulatory Visit (INDEPENDENT_AMBULATORY_CARE_PROVIDER_SITE_OTHER): Payer: No Typology Code available for payment source | Admitting: Certified Nurse Midwife

## 2019-11-15 VITALS — BP 110/70 | HR 68 | Temp 97.9°F | Resp 16 | Ht 64.5 in | Wt 143.0 lb

## 2019-11-15 DIAGNOSIS — Z862 Personal history of diseases of the blood and blood-forming organs and certain disorders involving the immune mechanism: Secondary | ICD-10-CM | POA: Diagnosis not present

## 2019-11-15 DIAGNOSIS — Z01419 Encounter for gynecological examination (general) (routine) without abnormal findings: Secondary | ICD-10-CM

## 2019-11-15 DIAGNOSIS — R5383 Other fatigue: Secondary | ICD-10-CM

## 2019-11-15 DIAGNOSIS — L709 Acne, unspecified: Secondary | ICD-10-CM

## 2019-11-15 DIAGNOSIS — L689 Hypertrichosis, unspecified: Secondary | ICD-10-CM

## 2019-11-15 DIAGNOSIS — Z0000 Encounter for general adult medical examination without abnormal findings: Secondary | ICD-10-CM

## 2019-11-15 DIAGNOSIS — E559 Vitamin D deficiency, unspecified: Secondary | ICD-10-CM

## 2019-11-15 NOTE — Progress Notes (Signed)
29 y.o. [id number] Single  African American Fe here for annual exam. Periods normal, no issues. Duration 4-5 days, cramping is less. Contraception natural family planning, follows cycle.  No other breast changes that she has noted. Working at home with children present, which has been stressful. Has noted more facial hair and acne that is not normal for her. Fatigue has increased, history of anemia. Eating fair amount of good diet. Desires screening labs today. Sees Urgent care if needed. No other health concern today..  Patient's last menstrual period was 10/22/2019 (exact date).          Sexually active: Yes.    The current method of family planning is none.    Exercising: Yes.    some Smoker:  no  Review of Systems  Constitutional: Negative.   HENT: Negative.   Eyes: Negative.   Respiratory: Negative.   Cardiovascular: Negative.   Gastrointestinal: Negative.   Genitourinary: Negative.   Musculoskeletal: Negative.   Skin: Negative.   Neurological: Negative.   Endo/Heme/Allergies: Negative.   Psychiatric/Behavioral: Negative.     Health Maintenance: Pap:  11-09-2018 neg HPV HR  neg History of Abnormal Pap: no MMG:  11-23-2018 rt breast u/s birads 2:neg Self Breast exams: yes Colonoscopy:  none BMD:   none TDaP:  2014 Shingles: no Pneumonia: no Hep C and HIV: both neg 2020 Labs: yes   reports that she has never smoked. She has never used smokeless tobacco. She reports that she does not drink alcohol or use drugs.  Past Medical History:  Diagnosis Date  . Anemia   . Anxiety   . Chlamydia contact, treated   . Depression   . Hx of varicella     Past Surgical History:  Procedure Laterality Date  . VAGINAL DELIVERY N/A 05/27/2016   Procedure: VAGINAL DELIVERY;  Surgeon: Crawford Givens, MD;  Location: [facility];  Service: Obstetrics;  Laterality: N/A;  . WISDOM TOOTH EXTRACTION      No current outpatient medications on file.   No current facility-administered  medications for this visit.    Family History  Problem Relation Age of Onset  . Schizophrenia Maternal Aunt   . Schizophrenia Maternal Uncle   . Fibroids Mother   . Hypertension Maternal Grandmother   . Hypertension Maternal Grandfather   . Stroke Maternal Grandfather   . Diabetes Paternal Grandmother   . Fibroids Maternal Aunt     ROS:  Pertinent items are noted in HPI.  Otherwise, a comprehensive ROS was negative.  Exam:   BP 110/70   Pulse 68   Temp 97.9 F (36.6 C) (Skin)   Resp 16   Ht 5' 4.5" (1.638 m)   Wt 143 lb (64.9 kg)   LMP 10/22/2019 (Exact Date)   BMI 24.17 kg/m  Height: 5' 4.5" (163.8 cm) Ht Readings from Last 3 Encounters:  11/15/19 5' 4.5" (1.638 m)  11/09/18 5' 4.5" (1.638 m)  05/27/16 5' 5.5" (1.664 m)    General appearance: alert, cooperative and appears stated age Head: Normocephalic, without obvious abnormality, atraumatic Neck: no adenopathy, supple, symmetrical, trachea midline and thyroid mildly enlarged bilateral, no nodules palpated Lungs: clear to auscultation bilaterally Breasts: normal appearance, no masses or tenderness, No nipple retraction or dimpling, No nipple discharge or bleeding, No axillary or supraclavicular adenopathy, 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 No rashes or lesions, facial hair noted and acne Lymph nodes: Cervical, supraclavicular, and axillary nodes normal. No abnormal inguinal nodes palpated Neurologic: Grossly normal   Pelvic: External genitalia:  no lesions              Urethra:  normal appearing urethra with no masses, tenderness or lesions              Bartholin's and Skene's: normal                  Vagina: normal appearing vagina with normal color and discharge, no lesions              Cervix: multiparous appearance, no cervical motion tenderness, no lesions and scant blood noted from cervix               Pap taken: No. Bimanual Exam:  Uterus:  normal size, contour, position, consistency, mobility, non-tender and anteverted              Adnexa: normal adnexa and no mass, fullness, tenderness               Rectovaginal: Confirms               Anus:  normal sphincter tone, no lesions  Chaperone present: yes  A:  Well Woman with normal exam  Contraception natural family planning  Increase facial hair and acne which is a change for her  Fatigue, history of depression and anemia  P:   Reviewed health and wellness pertinent to exam  Discussed checking testosterone level to see if change due hair growth, agreeable.  Discussed eating well balanced diet with protein and carbohydrates to help prevent anemia. Discussed time for self to help with depression prevention.  Screening labs: CBC,TSH, Lipid panel, CMP, Vitamin D, Testosterone free and total  Pap smear: no   counseled on breast self exam, STD prevention, HIV risk factors and prevention, feminine hygiene, adequate intake of calcium and vitamin D, diet and exercise  return annually or prn  An After Visit Summary was printed and given to the patient.

## 2019-11-15 NOTE — Patient Instructions (Signed)
EXERCISE AND DIET:  We recommended that you start or continue a regular exercise program for good health. Regular exercise means any activity that makes your heart beat faster and makes you sweat.  We recommend exercising at least 30 minutes per day at least 3 days a week, preferably 4 or 5.  We also recommend a diet low in fat and sugar.  Inactivity, poor dietary choices and obesity can cause diabetes, heart attack, stroke, and kidney damage, among others.   ° °ALCOHOL AND SMOKING:  Women should limit their alcohol intake to no more than 7 drinks/beers/glasses of wine (combined, not each!) per week. Moderation of alcohol intake to this level decreases your risk of breast cancer and liver damage. And of course, no recreational drugs are part of a healthy lifestyle.  And absolutely no smoking or even second hand smoke. Most people know smoking can cause heart and lung diseases, but did you know it also contributes to weakening of your bones? Aging of your skin?  Yellowing of your teeth and nails? ° °CALCIUM AND VITAMIN D:  Adequate intake of calcium and Vitamin D are recommended.  The recommendations for exact amounts of these supplements seem to change often, but generally speaking 600 mg of calcium (either carbonate or citrate) and 800 units of Vitamin D per day seems prudent. Certain women may benefit from higher intake of Vitamin D.  If you are among these women, your doctor will have told you during your visit.   ° °PAP SMEARS:  Pap smears, to check for cervical cancer or precancers,  have traditionally been done yearly, although recent scientific advances have shown that most women can have pap smears less often.  However, every woman still should have a physical exam from her gynecologist every year. It will include a breast check, inspection of the vulva and vagina to check for abnormal growths or skin changes, a visual exam of the cervix, and then an exam to evaluate the size and shape of the uterus and  ovaries.  And after 29 years of age, a rectal exam is indicated to check for rectal cancers. We will also provide age appropriate advice regarding health maintenance, like when you should have certain vaccines, screening for sexually transmitted diseases, bone density testing, colonoscopy, mammograms, etc.  ° °MAMMOGRAMS:  All women over 40 years old should have a yearly mammogram. Many facilities now offer a "3D" mammogram, which may cost around $50 extra out of pocket. If possible,  we recommend you accept the option to have the 3D mammogram performed.  It both reduces the number of women who will be called back for extra views which then turn out to be normal, and it is better than the routine mammogram at detecting truly abnormal areas.   ° °COLONOSCOPY:  Colonoscopy to screen for colon cancer is recommended for all women at age 50.  We know, you hate the idea of the prep.  We agree, BUT, having colon cancer and not knowing it is worse!!  Colon cancer so often starts as a polyp that can be seen and removed at colonscopy, which can quite literally save your life!  And if your first colonoscopy is normal and you have no family history of colon cancer, most women don't have to have it again for 10 years.  Once every ten years, you can do something that may end up saving your life, right?  We will be happy to help you get it scheduled when you are ready.    Be sure to check your insurance coverage so you understand how much it will cost.  It may be covered as a preventative service at no cost, but you should check your particular policy.   ° ° ° °Fatigue °If you have fatigue, you feel tired all the time and have a lack of energy or a lack of motivation. Fatigue may make it difficult to start or complete tasks because of exhaustion. In general, occasional or mild fatigue is often a normal response to activity or life. However, long-lasting (chronic) or extreme fatigue may be a symptom of a medical condition. °Follow  these instructions at home: °General instructions °· Watch your fatigue for any changes. °· Go to bed and get up at the same time every day. °· Avoid fatigue by pacing yourself during the day and getting enough sleep at night. °· Maintain a healthy weight. °Medicines °· Take over-the-counter and prescription medicines only as told by your health care provider. °· Take a multivitamin, if told by your health care provider.  °· Do not use herbal or dietary supplements unless they are approved by your health care provider. °Activity ° °· Exercise regularly, as told by your health care provider. °· Use or practice techniques to help you relax, such as yoga, tai chi, meditation, or massage therapy. °Eating and drinking ° °· Avoid heavy meals in the evening. °· Eat a well-balanced diet, which includes lean proteins, whole grains, plenty of fruits and vegetables, and low-fat dairy products. °· Avoid consuming too much caffeine. °· Avoid the use of alcohol. °· Drink enough fluid to keep your urine pale yellow. °Lifestyle °· Change situations that cause you stress. Try to keep your work and personal schedule in balance. °· Do not use any products that contain nicotine or tobacco, such as cigarettes and e-cigarettes. If you need help quitting, ask your health care provider. °· Do not use drugs. °Contact a health care provider if: °· Your fatigue does not get better. °· You have a fever. °· You suddenly lose or gain weight. °· You have headaches. °· You have trouble falling asleep or sleeping through the night. °· You feel angry, guilty, anxious, or sad. °· You are unable to have a bowel movement (constipation). °· Your skin is dry. °· You have swelling in your legs or another part of your body. °Get help right away if: °· You feel confused. °· Your vision is blurry. °· You feel faint or you pass out. °· You have a severe headache. °· You have severe pain in your abdomen, your back, or the area between your waist and hips  (pelvis). °· You have chest pain, shortness of breath, or an irregular or fast heartbeat. °· You are unable to urinate, or you urinate less than normal. °· You have abnormal bleeding, such as bleeding from the rectum, vagina, nose, lungs, or nipples. °· You vomit blood. °· You have thoughts about hurting yourself or others. °If you ever feel like you may hurt yourself or others, or have thoughts about taking your own life, get help right away. You can go to your nearest emergency department or call: °· Your local emergency services (911 in the [place] °· A suicide crisis helpline, such as the National Suicide Prevention Lifeline at 1-800-273-8255. This is open 24 hours a day. °Summary °· If you have fatigue, you feel tired all the time and have a lack of energy or a lack of motivation. °· Fatigue may make it difficult to start or   complete tasks because of exhaustion. °· Long-lasting (chronic) or extreme fatigue may be a symptom of a medical condition. °· Exercise regularly, as told by your health care provider. °· Change situations that cause you stress. Try to keep your work and personal schedule in balance. °This information is not intended to replace advice given to you by your health care provider. Make sure you discuss any questions you have with your health care provider. °Document Revised: 05/03/2019 Document Reviewed: 07/07/2017 °Elsevier Patient Education © 2020 [employer] ° ° °

## 2019-11-16 ENCOUNTER — Other Ambulatory Visit: Payer: Self-pay | Admitting: Certified Nurse Midwife

## 2019-11-16 DIAGNOSIS — E559 Vitamin D deficiency, unspecified: Secondary | ICD-10-CM

## 2019-11-17 ENCOUNTER — Other Ambulatory Visit: Payer: Self-pay

## 2019-11-17 MED ORDER — VITAMIN D (ERGOCALCIFEROL) 1.25 MG (50000 UNIT) PO CAPS: 50000.0000 [IU] | ORAL_CAPSULE | ORAL | 0 refills | Status: DC

## 2019-11-18 LAB — COMPREHENSIVE METABOLIC PANEL
ALT: 22 IU/L (ref 0–32)
AST: 16 IU/L (ref 0–40)
Albumin/Globulin Ratio: 1.8 (ref 1.2–2.2)
Albumin: 4.5 g/dL (ref 3.9–5.0)
Alkaline Phosphatase: 75 IU/L (ref 39–117)
BUN/Creatinine Ratio: 14 (ref 9–23)
BUN: 10 mg/dL (ref 6–20)
Bilirubin Total: 0.5 mg/dL (ref 0.0–1.2)
CO2: 25 mmol/L (ref 20–29)
Calcium: 8.9 mg/dL (ref 8.7–10.2)
Chloride: 103 mmol/L (ref 96–106)
Creatinine, Ser: 0.74 mg/dL (ref 0.57–1.00)
GFR calc Af Amer: 127 mL/min/{1.73_m2} (ref >59)
GFR calc non Af Amer: 111 mL/min/{1.73_m2} (ref >59)
Globulin, Total: 2.5 g/dL (ref 1.5–4.5)
Glucose: 68 mg/dL (ref 65–99)
Potassium: 4 mmol/L (ref 3.5–5.2)
Sodium: 139 mmol/L (ref 134–144)
Total Protein: 7 g/dL (ref 6.0–8.5)

## 2019-11-18 LAB — CBC
Hematocrit: 37.2 % (ref 34.0–46.6)
Hemoglobin: 12.6 g/dL (ref 11.1–15.9)
MCH: 29.5 pg (ref 26.6–33.0)
MCHC: 33.9 g/dL (ref 31.5–35.7)
MCV: 87 fL (ref 79–97)
Platelets: 289 10*3/uL (ref 150–450)
RBC: 4.27 x10E6/uL (ref 3.77–5.28)
RDW: 13.7 % (ref 11.7–15.4)
WBC: 8.4 10*3/uL (ref 3.4–10.8)

## 2019-11-18 LAB — LIPID PANEL
Chol/HDL Ratio: 3.3 ratio (ref 0.0–4.4)
Cholesterol, Total: 156 mg/dL (ref 100–199)
HDL: 47 mg/dL (ref >39)
LDL Chol Calc (NIH): 98 mg/dL (ref 0–99)
Triglycerides: 51 mg/dL (ref 0–149)
VLDL Cholesterol Cal: 11 mg/dL (ref 5–40)

## 2019-11-18 LAB — TESTOSTERONE,FREE AND TOTAL
Testosterone, Free: 1.3 pg/mL (ref 0.0–4.2)
Testosterone: 21 ng/dL (ref 8–48)

## 2019-11-18 LAB — TSH: TSH: 0.729 u[IU]/mL (ref 0.450–4.500)

## 2019-11-18 LAB — VITAMIN D 25 HYDROXY (VIT D DEFICIENCY, FRACTURES): Vit D, 25-Hydroxy: 16.8 ng/mL — ABNORMAL LOW (ref 30.0–100.0)

## 2019-11-23 ENCOUNTER — Other Ambulatory Visit: Payer: Self-pay

## 2019-11-23 ENCOUNTER — Ambulatory Visit (INDEPENDENT_AMBULATORY_CARE_PROVIDER_SITE_OTHER): Payer: No Typology Code available for payment source | Admitting: Neurology

## 2019-11-23 ENCOUNTER — Encounter: Payer: Self-pay | Admitting: Neurology

## 2019-11-23 VITALS — BP 103/63 | HR 80 | Temp 97.3°F | Ht 64.5 in | Wt 142.5 lb

## 2019-11-23 DIAGNOSIS — R930 Abnormal findings on diagnostic imaging of skull and head, not elsewhere classified: Secondary | ICD-10-CM | POA: Diagnosis not present

## 2019-11-23 DIAGNOSIS — R531 Weakness: Secondary | ICD-10-CM | POA: Diagnosis not present

## 2019-11-23 NOTE — Progress Notes (Signed)
PATIENT: Candice Bowman DOB: 1991-02-09  Chief Complaint  Patient presents with  . Numbness and Tingling    Reports right-sided numbness and tingling that started on 10/03/2019, along with a severe headache. Says the symptoms slowy started to resolve over the next 1.5 weeks.   Marland Kitchen PCP    No established PCP. Referred from ED.     HISTORICAL  Candice Bowman is a 29 year old female, seen in request by emergency room for evaluation of right side numbness, tingling, headaches, initial evaluation was on November 23, 2019.  I have reviewed and summarized the referring note from the referring physician.  She reported a history of headache since college, gradually getting worse, especially since 2018, become daily basis, bilateral frontal, temporal region pressure headaches, occasionally associated with nausea, light sensitivity, movement made it worse, lying down in dark quiet room was helpful  On October 03, 2019, she had intense right side ear pressure, headaches, also woke up with sudden onset right arm, leg numbness tingling, headache and right-sided paresthesia last about 9 days, gradually improve  But she continue have intermittent right fifth finger discomfort, tends to lock up  Personally reviewed CT head without contrast on October 03, 2019, 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  ALLERGIES: No Known Allergies  HOME MEDICATIONS: Current Outpatient Medications  Medication Sig Dispense Refill  . Vitamin D, Ergocalciferol, (DRISDOL) 1.25 MG (50000 UNIT) CAPS capsule Take 1 capsule (50,000 Units total) by mouth every 7 (seven) days. 12 capsule 0   No current facility-administered medications for this visit.    PAST MEDICAL HISTORY: Past Medical History:  Diagnosis Date  .  Anemia   . Anxiety   . Chlamydia contact, treated   . Depression   . Hx of varicella   . Numbness and tingling     PAST SURGICAL HISTORY: Past Surgical History:  Procedure Laterality Date  . VAGINAL DELIVERY N/A 05/27/2016   Procedure: VAGINAL DELIVERY;  Surgeon: Jaymes Graff, MD;  Location: [facility];  Service: Obstetrics;  Laterality: N/A;  . WISDOM TOOTH EXTRACTION      FAMILY HISTORY: Family History  Problem Relation Age of Onset  . Schizophrenia Maternal Aunt   . Schizophrenia Maternal Uncle   . Fibroids Mother   . Hypertension Maternal Grandmother   . Hypertension Maternal Grandfather   . Stroke Maternal Grandfather   . Diabetes Paternal Grandmother   . Fibroids Maternal Aunt   . Carpal tunnel syndrome Father     SOCIAL HISTORY: Social History   Socioeconomic History  . Marital status: Married    Spouse name: Not on file  . Number of children: 2  . Years of education: college  . Highest education level: Bachelor's degree (e.g., BA, AB, BS)  Occupational History  . Occupation: team lead - customer service  Tobacco Use  . Smoking status: Never Smoker  . Smokeless tobacco: Never Used  Substance and Sexual Activity  . Alcohol use: No  . Drug use: No  . Sexual activity: Yes    Partners: Male    Birth control/protection: None  Other Topics Concern  . Not on file  Social History Narrative   Lives at home with twins (one boy and one girl).   Right-handed.   No daily caffeine use.   Social Determinants of Health  Financial Resource Strain:   . Difficulty of Paying Living Expenses: Not on file  Food Insecurity:   . Worried About Programme researcher, broadcasting/film/video in the Last Year: Not on file  . Ran Out of Food in the Last Year: Not on file  Transportation Needs:   . Lack of Transportation (Medical): Not on file  . Lack of Transportation (Non-Medical): Not on file  Physical Activity:   . Days of Exercise per Week: Not on file  . Minutes of Exercise per  Session: Not on file  Stress:   . Feeling of Stress : Not on file  Social Connections:   . Frequency of Communication with Friends and Family: Not on file  . Frequency of Social Gatherings with Friends and Family: Not on file  . Attends Religious Services: Not on file  . Active Member of Clubs or Organizations: Not on file  . Attends Banker Meetings: Not on file  . Marital Status: Not on file  Intimate Partner Violence:   . Fear of Current or Ex-Partner: Not on file  . Emotionally Abused: Not on file  . Physically Abused: Not on file  . Sexually Abused: Not on file     PHYSICAL EXAM   Vitals:   11/23/19 1438  BP: 103/63  Pulse: 80  Temp: (!) 97.3 F (36.3 C)  Weight: 142 lb 8 oz (64.6 kg)  Height: 5' 4.5" (1.638 m)    Not recorded      Body mass index is 24.08 kg/m.  PHYSICAL EXAMNIATION:  Gen: NAD, conversant, well nourised, well groomed                     Cardiovascular: Regular rate rhythm, no peripheral edema, warm, nontender. Eyes: Conjunctivae clear without exudates or hemorrhage Neck: Supple, no carotid bruits. Pulmonary: Clear to auscultation bilaterally   NEUROLOGICAL EXAM:  MENTAL STATUS: Speech:    Speech is normal; fluent and spontaneous with normal comprehension.  Cognition:     Orientation to time, place and person     Normal recent and remote memory     Normal Attention span and concentration     Normal Language, naming, repeating,spontaneous speech     Fund of knowledge   CRANIAL NERVES: CN II: Visual fields are full to confrontation. Pupils are round equal and briskly reactive to light. CN III, IV, VI: extraocular movement are normal. No ptosis. CN V: Facial sensation is intact to light touch CN VII: Face is symmetric with normal eye closure  CN VIII: Hearing is normal to causal conversation. CN IX, X: Phonation is normal. CN XI: Head turning and shoulder shrug are intact  MOTOR: Mild fixation of right arm upon rapid  rotating movement, mild right shoulder abduction, external rotation weakness.  REFLEXES: Reflexes are brisk on the right side, involving right biceps, triceps, knees, and ankles. Plantar responses are flexor bilaterally SENSORY: Intact to light touch, pinprick and vibratory sensation are intact in fingers and toes.  COORDINATION: There is no trunk or limb dysmetria noted.  GAIT/STANCE: Posture is normal. Gait is steady with normal steps, base, arm swing, and turning. Heel and toe walking are normal. Tandem gait is normal.  Romberg is absent.   DIAGNOSTIC DATA (LABS, IMAGING, TESTING) - I reviewed patient records, labs, notes, testing and imaging myself where available.   ASSESSMENT AND PLAN  Candice Bowman is a 29 y.o. female   Abnormal CT brain,  Dysmorphic ventricular system, absence of septum pellucidum  Frequent headaches, with right arm and leg weakness  MRI of brain with without contrast to rule out left hemisphere pathology  She also complains of right hand paresthesia, right fifth finger numbness,  MRI of cervical spine with without contrast to rule out cervical structural lesion  Marcial Pacas, M.D. Ph.D.  [facility] [address] [place], [place] [address] Ph: [phone] Fax: [phone]  CC: Referring Provider

## 2019-12-13 ENCOUNTER — Other Ambulatory Visit: Payer: Self-pay

## 2019-12-13 ENCOUNTER — Ambulatory Visit: Payer: PRIVATE HEALTH INSURANCE

## 2019-12-13 DIAGNOSIS — R531 Weakness: Secondary | ICD-10-CM | POA: Diagnosis not present

## 2019-12-13 DIAGNOSIS — R930 Abnormal findings on diagnostic imaging of skull and head, not elsewhere classified: Secondary | ICD-10-CM | POA: Diagnosis not present

## 2019-12-13 MED ORDER — GADOBENATE DIMEGLUMINE 529 MG/ML IV SOLN
15.0000 mL | Freq: Once | INTRAVENOUS | Status: AC | PRN
  Administered 2019-12-13: 15 mL via INTRAVENOUS

## 2019-12-27 ENCOUNTER — Encounter: Payer: Self-pay | Admitting: Neurology

## 2019-12-27 ENCOUNTER — Ambulatory Visit (INDEPENDENT_AMBULATORY_CARE_PROVIDER_SITE_OTHER): Payer: No Typology Code available for payment source | Admitting: Neurology

## 2019-12-27 VITALS — BP 99/64 | HR 73 | Temp 97.7°F | Ht 64.5 in | Wt 149.0 lb

## 2019-12-27 DIAGNOSIS — G911 Obstructive hydrocephalus: Secondary | ICD-10-CM | POA: Insufficient documentation

## 2019-12-27 DIAGNOSIS — G43709 Chronic migraine without aura, not intractable, without status migrainosus: Secondary | ICD-10-CM

## 2019-12-27 DIAGNOSIS — G919 Hydrocephalus, unspecified: Secondary | ICD-10-CM

## 2019-12-27 DIAGNOSIS — IMO0002 Reserved for concepts with insufficient information to code with codable children: Secondary | ICD-10-CM | POA: Insufficient documentation

## 2019-12-27 MED ORDER — SUMATRIPTAN SUCCINATE 50 MG PO TABS: 50.0000 mg | ORAL_TABLET | ORAL | 6 refills | Status: DC | PRN

## 2019-12-27 NOTE — Progress Notes (Signed)
PATIENT: Candice Bowman DOB: 28-Oct-1990  Chief Complaint  Patient presents with  . Paresthesia/Weakness    She would like to discuss her MRI results (cervical, brain). She has only experienced a mild improvement in symptoms.      HISTORICAL  Candice Bowman is a 29 year old female, seen in request by emergency room for evaluation of right side numbness, tingling, headaches, initial evaluation was on November 23, 2019.  I have reviewed and summarized the referring note from the referring physician.  She reported a history of headache since college, gradually getting worse, especially since 2018, become daily basis, bilateral frontal, temporal region pressure headaches, occasionally associated with nausea, light sensitivity, movement made it worse, lying down in dark quiet room was helpful  On October 03, 2019, she had intense right side ear pressure, headaches, also woke up with sudden onset right arm, leg numbness tingling, headache and right-sided paresthesia last about 9 days, gradually improve  But she continue have intermittent right fifth finger discomfort, tends to lock up  Personally reviewed CT head without contrast on October 03, 2019, 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  ALLERGIES: No Known Allergies  HOME MEDICATIONS: Current Outpatient Medications   Medication Sig Dispense Refill  . Vitamin D, Ergocalciferol, (DRISDOL) 1.25 MG (50000 UNIT) CAPS capsule Take 1 capsule (50,000 Units total) by mouth every 7 (seven) days. 12 capsule 0   No current facility-administered medications for this visit.    PAST MEDICAL HISTORY: Past Medical History:  Diagnosis Date  . Anemia   . Anxiety   . Chlamydia contact, treated   . Depression   . Hx of varicella   . Numbness and tingling     PAST SURGICAL HISTORY: Past Surgical History:  Procedure Laterality Date  . VAGINAL DELIVERY N/A 05/27/2016   Procedure: VAGINAL DELIVERY;  Surgeon: Jaymes Graff, MD;  Location: [facility];  Service: Obstetrics;  Laterality: N/A;  . WISDOM TOOTH EXTRACTION      FAMILY HISTORY: Family History  Problem Relation Age of Onset  . Schizophrenia Maternal Aunt   . Schizophrenia Maternal Uncle   . Fibroids Mother   . Hypertension Maternal Grandmother   . Hypertension Maternal Grandfather   . Stroke Maternal Grandfather   . Diabetes Paternal Grandmother   . Fibroids Maternal Aunt   . Carpal tunnel syndrome Father     SOCIAL HISTORY: Social History   Socioeconomic History  . Marital status: Married    Spouse name: Not on file  . Number of children: 2  . Years of education: college  . Highest education level: Bachelor's degree (e.g., BA, AB, BS)  Occupational History  . Occupation: team lead - customer service  Tobacco Use  . Smoking status: Never Smoker  . Smokeless tobacco: Never Used  Substance and Sexual Activity  . Alcohol use: No  . Drug use: No  . Sexual activity: Yes    Partners: Male  Birth control/protection: None  Other Topics Concern  . Not on file  Social History Narrative   Lives at home with twins (one boy and one girl).   Right-handed.   No daily caffeine use.   Social Determinants of Health   Financial Resource Strain:   . Difficulty of Paying Living Expenses: Not on file  Food Insecurity:   . Worried About  Programme researcher, broadcasting/film/video in the Last Year: Not on file  . Ran Out of Food in the Last Year: Not on file  Transportation Needs:   . Lack of Transportation (Medical): Not on file  . Lack of Transportation (Non-Medical): Not on file  Physical Activity:   . Days of Exercise per Week: Not on file  . Minutes of Exercise per Session: Not on file  Stress:   . Feeling of Stress : Not on file  Social Connections:   . Frequency of Communication with Friends and Family: Not on file  . Frequency of Social Gatherings with Friends and Family: Not on file  . Attends Religious Services: Not on file  . Active Member of Clubs or Organizations: Not on file  . Attends Banker Meetings: Not on file  . Marital Status: Not on file  Intimate Partner Violence:   . Fear of Current or Ex-Partner: Not on file  . Emotionally Abused: Not on file  . Physically Abused: Not on file  . Sexually Abused: Not on file     PHYSICAL EXAM   Vitals:   12/27/19 1311  BP: 99/64  Pulse: 73  Temp: 97.7 F (36.5 C)  Weight: 149 lb (67.6 kg)  Height: 5' 4.5" (1.638 m)    Not recorded      Body mass index is 25.18 kg/m.  PHYSICAL EXAMNIATION:   NEUROLOGICAL EXAM:  MENTAL STATUS: Speech:    Speech is normal; fluent and spontaneous with normal comprehension.  Cognition:     Orientation to time, place and person     Normal recent and remote memory     Normal Attention span and concentration     Normal Language, naming, repeating,spontaneous speech     Fund of knowledge   CRANIAL NERVES: CN II: Visual fields are full to confrontation. Pupils are round equal and briskly reactive to light. CN III, IV, VI: extraocular movement are normal. No ptosis. CN V: Facial sensation is intact to light touch CN VII: Face is symmetric with normal eye closure  CN VIII: Hearing is normal to causal conversation. CN IX, X: Phonation is normal. CN XI: Head turning and shoulder shrug are intact  MOTOR: Mild fixation  of right arm upon rapid rotating movement, mild right shoulder abduction, external rotation weakness.  REFLEXES: Reflexes are brisk on the right side, involving right biceps, triceps, knees, and ankles. Plantar responses are flexor bilaterally  COORDINATION: There is no trunk or limb dysmetria noted.  GAIT/STANCE: Posture is normal. Gait is steady with normal steps, base, arm swing, and turning. Heel and toe walking are normal. Tandem gait is normal.  Romberg is absent.   DIAGNOSTIC DATA (LABS, IMAGING, TESTING) - I reviewed patient records, labs, notes, testing and imaging myself where available.   ASSESSMENT AND PLAN  Candice Bowman is a 29 y.o. female   Abnormal CT, MRI scans  Dysmorphic ventricular system, absence of septum pellucidum, enlarged bilateral ventricle.  Frequent headaches, with migraine features  I have suggested magnesium oxide, riboflavin as preventive medication  Imitrex as needed  Only return  to clinic for new issues  Marcial Pacas, M.D. Ph.D.  [facility] [address] [place], [place] [address] Ph: [phone] Fax: [phone]  CC: Referring Provider

## 2019-12-27 NOTE — Patient Instructions (Signed)
Magnesium oxide 400mg , riboflavin 100mg  twice a day  Imitrex as needed for headache

## 2020-01-08 ENCOUNTER — Encounter: Payer: Self-pay | Admitting: Certified Nurse Midwife

## 2020-01-10 ENCOUNTER — Encounter: Payer: Self-pay | Admitting: Certified Nurse Midwife

## 2020-02-14 ENCOUNTER — Other Ambulatory Visit: Payer: PRIVATE HEALTH INSURANCE

## 2020-03-21 ENCOUNTER — Other Ambulatory Visit: Payer: No Typology Code available for payment source

## 2020-03-21 ENCOUNTER — Other Ambulatory Visit: Payer: Self-pay | Admitting: *Deleted

## 2020-03-21 DIAGNOSIS — E559 Vitamin D deficiency, unspecified: Secondary | ICD-10-CM

## 2020-11-20 ENCOUNTER — Ambulatory Visit: Payer: PRIVATE HEALTH INSURANCE | Admitting: Certified Nurse Midwife

## 2021-02-13 NOTE — Progress Notes (Deleted)
30 y.o. G62P1002 Married [place] American female here for annual exam.      No LMP recorded.            Sexually active: {yes no:314532}  The current method of family planning is {contraception:315051}.    Exercising: {yes no:314532}  {types:19826} Smoker:  {YES NO:22349}  Health Maintenance: Pap:  11-09-2018 neg HPV HR neg History of abnormal Pap:  no MMG:  11-23-2018 rt breast u/s birads 2:neg Colonoscopy:  none BMD:   none Gardasil:   *** Covid-19: *** Hep C testing: neg 2020 Screening Labs: ***   reports that she has never smoked. She has never used smokeless tobacco. She reports that she does not drink alcohol and does not use drugs.  Past Medical History:  Diagnosis Date  . Anemia   . Anxiety   . Chlamydia contact, treated   . Depression   . Hx of varicella   . Numbness and tingling     Past Surgical History:  Procedure Laterality Date  . VAGINAL DELIVERY N/A 05/27/2016   Procedure: VAGINAL DELIVERY;  Surgeon: Jaymes Graff, MD;  Location: [facility];  Service: Obstetrics;  Laterality: N/A;  . WISDOM TOOTH EXTRACTION      Current Outpatient Medications  Medication Sig Dispense Refill  . SUMAtriptan (IMITREX) 50 MG tablet Take 1 tablet (50 mg total) by mouth every 2 (two) hours as needed for migraine. May repeat in 2 hours if headache persists or recurs. 12 tablet 6  . Vitamin D, Ergocalciferol, (DRISDOL) 1.25 MG (50000 UNIT) CAPS capsule Take 1 capsule (50,000 Units total) by mouth every 7 (seven) days. 12 capsule 0   No current facility-administered medications for this visit.    Family History  Problem Relation Age of Onset  . Schizophrenia Maternal Aunt   . Schizophrenia Maternal Uncle   . Fibroids Mother   . Hypertension Maternal Grandmother   . Hypertension Maternal Grandfather   . Stroke Maternal Grandfather   . Diabetes Paternal Grandmother   . Fibroids Maternal Aunt   . Carpal tunnel syndrome Father     Review of Systems  Exam:    There were no vitals taken for this visit.     General appearance: alert, cooperative and appears stated age, no acute distress Head: Normocephalic, without obvious abnormality Neck: no adenopathy, thyroid {EXAM; THYROID:18604} Lungs: clear to auscultation bilaterally Breasts: {Exam; breast:13139::"normal appearance, no masses or tenderness"} Heart: regular rate and rhythm Abdomen: soft, non-tender; no masses,  no organomegaly Extremities: extremities normal, no edema Skin: No rashes or lesions Lymph nodes: Cervical, supraclavicular, and axillary nodes normal. No abnormal inguinal nodes palpated Neurologic: Grossly normal   Pelvic: External genitalia:  no lesions              Urethra:  normal appearing urethra with no masses, tenderness or lesions              Bartholins and Skenes: normal                 Vagina: normal appearing vagina, appropriate for age, normal appearing discharge, no lesions              Cervix: neg cervical motion tenderness, no visible lesions             Bimanual Exam:   Uterus:  {exam; uterus:12215}              Adnexa: {exam; adnexa:12223}                 ***,  CMA Chaperone was present for exam.  A:  Well Woman with normal exam  P:   Pap :  Mammogram:  Labs:  Medications:

## 2021-02-17 ENCOUNTER — Other Ambulatory Visit: Payer: Self-pay

## 2021-02-17 ENCOUNTER — Ambulatory Visit: Payer: No Typology Code available for payment source | Admitting: Nurse Practitioner

## 2021-02-17 ENCOUNTER — Ambulatory Visit (INDEPENDENT_AMBULATORY_CARE_PROVIDER_SITE_OTHER): Payer: No Typology Code available for payment source | Admitting: Nurse Practitioner

## 2021-02-17 ENCOUNTER — Encounter: Payer: Self-pay | Admitting: Nurse Practitioner

## 2021-02-17 VITALS — BP 118/74 | Ht 65.0 in | Wt 153.0 lb

## 2021-02-17 DIAGNOSIS — Z8639 Personal history of other endocrine, nutritional and metabolic disease: Secondary | ICD-10-CM | POA: Diagnosis not present

## 2021-02-17 DIAGNOSIS — B9689 Other specified bacterial agents as the cause of diseases classified elsewhere: Secondary | ICD-10-CM

## 2021-02-17 DIAGNOSIS — N898 Other specified noninflammatory disorders of vagina: Secondary | ICD-10-CM

## 2021-02-17 DIAGNOSIS — Z01419 Encounter for gynecological examination (general) (routine) without abnormal findings: Secondary | ICD-10-CM | POA: Diagnosis not present

## 2021-02-17 DIAGNOSIS — Z113 Encounter for screening for infections with a predominantly sexual mode of transmission: Secondary | ICD-10-CM

## 2021-02-17 DIAGNOSIS — N760 Acute vaginitis: Secondary | ICD-10-CM | POA: Diagnosis not present

## 2021-02-17 LAB — WET PREP FOR TRICH, YEAST, CLUE

## 2021-02-17 MED ORDER — METRONIDAZOLE 500 MG PO TABS: 500.0000 mg | ORAL_TABLET | Freq: Two times a day (BID) | ORAL | 0 refills | Status: DC

## 2021-02-17 NOTE — Patient Instructions (Signed)
Health Maintenance, Female Adopting a healthy lifestyle and getting preventive care are important in promoting health and wellness. Ask your health care provider about:  The right schedule for you to have regular tests and exams.  Things you can do on your own to prevent diseases and keep yourself healthy. What should I know about diet, weight, and exercise? Eat a healthy diet  Eat a diet that includes plenty of vegetables, fruits, low-fat dairy products, and lean protein.  Do not eat a lot of foods that are high in solid fats, added sugars, or sodium.   Maintain a healthy weight Body mass index (BMI) is used to identify weight problems. It estimates body fat based on height and weight. Your health care provider can help determine your BMI and help you achieve or maintain a healthy weight. Get regular exercise Get regular exercise. This is one of the most important things you can do for your health. Most adults should:  Exercise for at least 150 minutes each week. The exercise should increase your heart rate and make you sweat (moderate-intensity exercise).  Do strengthening exercises at least twice a week. This is in addition to the moderate-intensity exercise.  Spend less time sitting. Even light physical activity can be beneficial. Watch cholesterol and blood lipids Have your blood tested for lipids and cholesterol at 30 years of age, then have this test every 5 years. Have your cholesterol levels checked more often if:  Your lipid or cholesterol levels are high.  You are older than 30 years of age.  You are at high risk for heart disease. What should I know about cancer screening? Depending on your health history and family history, you may need to have cancer screening at various ages. This may include screening for:  Breast cancer.  Cervical cancer.  Colorectal cancer.  Skin cancer.  Lung cancer. What should I know about heart disease, diabetes, and high blood  pressure? Blood pressure and heart disease  High blood pressure causes heart disease and increases the risk of stroke. This is more likely to develop in people who have high blood pressure readings, are of African descent, or are overweight.  Have your blood pressure checked: ? Every 3-5 years if you are 18-39 years of age. ? Every year if you are 40 years old or older. Diabetes Have regular diabetes screenings. This checks your fasting blood sugar level. Have the screening done:  Once every three years after age 40 if you are at a normal weight and have a low risk for diabetes.  More often and at a younger age if you are overweight or have a high risk for diabetes. What should I know about preventing infection? Hepatitis B If you have a higher risk for hepatitis B, you should be screened for this virus. Talk with your health care provider to find out if you are at risk for hepatitis B infection. Hepatitis C Testing is recommended for:  Everyone born from 1945 through 1965.  Anyone with known risk factors for hepatitis C. Sexually transmitted infections (STIs)  Get screened for STIs, including gonorrhea and chlamydia, if: ? You are sexually active and are younger than 30 years of age. ? You are older than 30 years of age and your health care provider tells you that you are at risk for this type of infection. ? Your sexual activity has changed since you were last screened, and you are at increased risk for chlamydia or gonorrhea. Ask your health care provider   if you are at risk.  Ask your health care provider about whether you are at high risk for HIV. Your health care provider may recommend a prescription medicine to help prevent HIV infection. If you choose to take medicine to prevent HIV, you should first get tested for HIV. You should then be tested every 3 months for as long as you are taking the medicine. Pregnancy  If you are about to stop having your period (premenopausal) and  you may become pregnant, seek counseling before you get pregnant.  Take 400 to 800 micrograms (mcg) of folic acid every day if you become pregnant.  Ask for birth control (contraception) if you want to prevent pregnancy. Osteoporosis and menopause Osteoporosis is a disease in which the bones lose minerals and strength with aging. This can result in bone fractures. If you are 65 years old or older, or if you are at risk for osteoporosis and fractures, ask your health care provider if you should:  Be screened for bone loss.  Take a calcium or vitamin D supplement to lower your risk of fractures.  Be given hormone replacement therapy (HRT) to treat symptoms of menopause. Follow these instructions at home: Lifestyle  Do not use any products that contain nicotine or tobacco, such as cigarettes, e-cigarettes, and chewing tobacco. If you need help quitting, ask your health care provider.  Do not use street drugs.  Do not share needles.  Ask your health care provider for help if you need support or information about quitting drugs. Alcohol use  Do not drink alcohol if: ? Your health care provider tells you not to drink. ? You are pregnant, may be pregnant, or are planning to become pregnant.  If you drink alcohol: ? Limit how much you use to 0-1 drink a day. ? Limit intake if you are breastfeeding.  Be aware of how much alcohol is in your drink. In the [place] one drink equals one 12 oz bottle of beer (355 mL), one 5 oz glass of wine (148 mL), or one 1 oz glass of hard liquor (44 mL). General instructions  Schedule regular health, dental, and eye exams.  Stay current with your vaccines.  Tell your health care provider if: ? You often feel depressed. ? You have ever been abused or do not feel safe at home. Summary  Adopting a healthy lifestyle and getting preventive care are important in promoting health and wellness.  Follow your health care provider's instructions about healthy  diet, exercising, and getting tested or screened for diseases.  Follow your health care provider's instructions on monitoring your cholesterol and blood pressure. This information is not intended to replace advice given to you by your health care provider. Make sure you discuss any questions you have with your health care provider. Document Revised: 10/05/2018 Document Reviewed: 10/05/2018 Elsevier Patient Education  2021 Elsevier Inc.  

## 2021-02-17 NOTE — Progress Notes (Signed)
   Candice Bowman May 10, 1991 [id number]   History:  30 y.o. G1P1002 presents for annual exam. Complains of vaginal odor that comes and goes. Monthly cycles, using natural family planning for contraception. Normal pap history. Had hernia repair, tummy tuck and diastasis recti repair 4 weeks ago, recovering well.   Gynecologic History Patient's last menstrual period was 02/09/2021. Period Cycle (Days): 28 Period Duration (Days): 5 Period Pattern: Regular Menstrual Flow: Moderate Dysmenorrhea: None Contraception/Family planning: rhythm method  Health Maintenance Last Pap: 11/09/2018. Results were: normal Last mammogram: N/A. Right breast ultrasound 11/23/2018 negative Last colonoscopy: N/A  Last Dexa: N/A  Past medical history, past surgical history, family history and social history were all reviewed and documented in the EPIC chart. Twins - 30 yo boy & girl. Works for [employer]. Recent separation.   ROS:  A ROS was performed and pertinent positives and negatives are included.  Exam:  Vitals:   02/17/21 1415  BP: 118/74  Weight: 153 lb (69.4 kg)  Height: 5\' 5"  (1.651 m)   Body mass index is 25.46 kg/m.  General appearance:  Normal Thyroid:  Symmetrical, normal in size, without palpable masses or nodularity. Respiratory  Auscultation:  Clear without wheezing or rhonchi Cardiovascular  Auscultation:  Regular rate, without rubs, murmurs or gallops  Edema/varicosities:  Not grossly evident Abdominal  Soft,nontender, without masses, guarding or rebound.  Liver/spleen:  No organomegaly noted  Hernia:  None appreciated  Steri strip along lower abdomen from recent surgery, healing well  Skin  Inspection:  Grossly normal Breasts: Examined lying and sitting.   Right: Without masses, retractions, nipple discharge or axillary adenopathy.   Left: Without masses, retractions, nipple discharge or axillary adenopathy. Gentitourinary   Inguinal/mons:  Normal without inguinal  adenopathy  External genitalia:  Normal appearing vulva with no masses, tenderness, or lesions  BUS/Urethra/Skene's glands:  Normal  Vagina:  Normal appearing with normal color and discharge, no lesions  Cervix:  Normal appearing without discharge or lesions  Uterus:  Normal in size, shape and contour.  Midline and mobile, nontender  Adnexa/parametria:     Rt: Normal in size, without masses or tenderness.   Lt: Normal in size, without masses or tenderness.  Anus and perineum: Normal  Wet prep + clue cells.   Assessment/Plan:  29 y.o. G1P1001 for annual exam.   Well female exam with routine gynecological exam - Plan: CBC with Differential/Platelet, Comprehensive metabolic panel. Education provided on SBEs, importance of preventative screenings, current guidelines, high calcium diet, regular exercise, and multivitamin daily.   History of vitamin D deficiency - Plan: VITAMIN D 25 Hydroxy (Vit-D Deficiency, Fractures). Has not been taking Vitamin D supplement consistently.   Bacterial vaginosis - Plan: metroNIDAZOLE (FLAGYL) 500 MG tablet twice daily x 7 days.   Screen for STD (sexually transmitted disease) - Plan: C. trachomatis/N. gonorrhoeae RNA, RPR, HIV Antibody (routine testing w rflx)  Vaginal odor - Plan: WET PREP FOR TRICH, YEAST, CLUE  Screening for cervical cancer - normal pap history. Will repeat at 5-year interval per guidelines.   Return in 1 year for annual.      37 DNP, 2:29 PM 02/17/2021

## 2021-02-18 ENCOUNTER — Other Ambulatory Visit: Payer: Self-pay | Admitting: Nurse Practitioner

## 2021-02-18 DIAGNOSIS — E559 Vitamin D deficiency, unspecified: Secondary | ICD-10-CM

## 2021-02-18 LAB — COMPREHENSIVE METABOLIC PANEL
AG Ratio: 1.4 (calc) (ref 1.0–2.5)
ALT: 25 U/L (ref 6–29)
AST: 18 U/L (ref 10–30)
Albumin: 4.3 g/dL (ref 3.6–5.1)
Alkaline phosphatase (APISO): 68 U/L (ref 31–125)
BUN: 9 mg/dL (ref 7–25)
CO2: 26 mmol/L (ref 20–32)
Calcium: 9.2 mg/dL (ref 8.6–10.2)
Chloride: 104 mmol/L (ref 98–110)
Creat: 0.82 mg/dL (ref 0.50–1.10)
Globulin: 3.1 g/dL (calc) (ref 1.9–3.7)
Glucose, Bld: 74 mg/dL (ref 65–99)
Potassium: 4.1 mmol/L (ref 3.5–5.3)
Sodium: 139 mmol/L (ref 135–146)
Total Bilirubin: 0.4 mg/dL (ref 0.2–1.2)
Total Protein: 7.4 g/dL (ref 6.1–8.1)

## 2021-02-18 LAB — RPR: RPR Ser Ql: NONREACTIVE

## 2021-02-18 LAB — CBC WITH DIFFERENTIAL/PLATELET
Absolute Monocytes: 533 cells/uL (ref 200–950)
Basophils Absolute: 81 cells/uL (ref 0–200)
Basophils Relative: 1.1 %
Eosinophils Absolute: 148 cells/uL (ref 15–500)
Eosinophils Relative: 2 %
HCT: 35.7 % (ref 35.0–45.0)
Hemoglobin: 11.5 g/dL — ABNORMAL LOW (ref 11.7–15.5)
Lymphs Abs: 1421 cells/uL (ref 850–3900)
MCH: 27.9 pg (ref 27.0–33.0)
MCHC: 32.2 g/dL (ref 32.0–36.0)
MCV: 86.7 fL (ref 80.0–100.0)
MPV: 9.8 fL (ref 7.5–12.5)
Monocytes Relative: 7.2 %
Neutro Abs: 5217 cells/uL (ref 1500–7800)
Neutrophils Relative %: 70.5 %
Platelets: 330 10*3/uL (ref 140–400)
RBC: 4.12 10*6/uL (ref 3.80–5.10)
RDW: 12.6 % (ref 11.0–15.0)
Total Lymphocyte: 19.2 %
WBC: 7.4 10*3/uL (ref 3.8–10.8)

## 2021-02-18 LAB — HIV ANTIBODY (ROUTINE TESTING W REFLEX): HIV 1&2 Ab, 4th Generation: NONREACTIVE

## 2021-02-18 LAB — C. TRACHOMATIS/N. GONORRHOEAE RNA
C. trachomatis RNA, TMA: NOT DETECTED
N. gonorrhoeae RNA, TMA: NOT DETECTED

## 2021-02-18 LAB — VITAMIN D 25 HYDROXY (VIT D DEFICIENCY, FRACTURES): Vit D, 25-Hydroxy: 22 ng/mL — ABNORMAL LOW (ref 30–100)

## 2021-02-18 MED ORDER — VITAMIN D (ERGOCALCIFEROL) 1.25 MG (50000 UNIT) PO CAPS: 50000.0000 [IU] | ORAL_CAPSULE | ORAL | 0 refills | Status: AC

## 2021-09-03 ENCOUNTER — Ambulatory Visit (INDEPENDENT_AMBULATORY_CARE_PROVIDER_SITE_OTHER): Payer: No Typology Code available for payment source | Admitting: Nurse Practitioner

## 2021-09-03 ENCOUNTER — Encounter: Payer: Self-pay | Admitting: Nurse Practitioner

## 2021-09-03 ENCOUNTER — Other Ambulatory Visit: Payer: Self-pay

## 2021-09-03 VITALS — BP 116/74

## 2021-09-03 DIAGNOSIS — Z113 Encounter for screening for infections with a predominantly sexual mode of transmission: Secondary | ICD-10-CM

## 2021-09-03 DIAGNOSIS — N760 Acute vaginitis: Secondary | ICD-10-CM

## 2021-09-03 DIAGNOSIS — B9689 Other specified bacterial agents as the cause of diseases classified elsewhere: Secondary | ICD-10-CM

## 2021-09-03 DIAGNOSIS — N898 Other specified noninflammatory disorders of vagina: Secondary | ICD-10-CM | POA: Diagnosis not present

## 2021-09-03 LAB — WET PREP FOR TRICH, YEAST, CLUE

## 2021-09-03 MED ORDER — METRONIDAZOLE 500 MG PO TABS: 500.0000 mg | ORAL_TABLET | Freq: Two times a day (BID) | ORAL | 0 refills | Status: DC

## 2021-09-03 NOTE — Progress Notes (Signed)
   Acute Office Visit  Subjective:    Patient ID: Candice Bowman, female    DOB: 03-23-1991, 30 y.o.   MRN: [id number]   HPI 30 y.o. presents today for vaginal odor without discharge and odor. She feels odor is intermittent. She uses boric acid suppositories.  Sexually active with one partner but unsure of their fidelity.    Review of Systems  Constitutional: Negative.   Genitourinary:  Negative for genital sores, vaginal discharge and vaginal pain.       Vaginal odor      Objective:    Physical Exam Constitutional:      Appearance: Normal appearance.  Genitourinary:    General: Normal vulva.     Vagina: Normal.     Cervix: Normal.    BP 116/74   LMP 08/18/2021  Wt Readings from Last 3 Encounters:  02/17/21 153 lb (69.4 kg)  12/27/19 149 lb (67.6 kg)  11/23/19 142 lb 8 oz (64.6 kg)   Wet prep + clue cells     Assessment & Plan:   Problem List Items Addressed This Visit   None Visit Diagnoses     Bacterial vaginosis    -  Primary   Relevant Medications   metroNIDAZOLE (FLAGYL) 500 MG tablet   Vaginal odor       Relevant Orders   WET PREP FOR TRICH, YEAST, CLUE   Screen for STD (sexually transmitted disease)       Relevant Orders   SURESWAB CT/NG/T. vaginalis      Plan: Wet prep positive for clue cells - Flagyl 500 mg BID x 7 days. Recommend women's health probiotic. STD panel pending.      Olivia Mackie DNP, 3:21 PM 09/03/2021

## 2021-09-04 LAB — SURESWAB CT/NG/T. VAGINALIS
C. trachomatis RNA, TMA: NOT DETECTED
N. gonorrhoeae RNA, TMA: NOT DETECTED
Trichomonas vaginalis RNA: NOT DETECTED

## 2021-10-10 IMAGING — CT CT HEAD W/O CM
4 series · 15 of 47 positions shown, 17 images · non-contrast
Comparison: None

CLINICAL DATA: Focal neurological deficit for 6 hours, suspected
stroke, RIGHT-sided numbness with frontal pain, urine facial
pressure, symptoms since [REDACTED]

EXAM:
CT HEAD WITHOUT CONTRAST
TECHNIQUE: Contiguous axial images were obtained from the base of the skull
through the vertex without intravenous contrast. Sagittal and
coronal MPR images reconstructed from axial data set.

[Series 3: head without · axial · non-contrast · 0.44mm/px · z∈[-97,+23]mm · 7 of 34 slices shown, 9 images]
[im 5/34  brain]
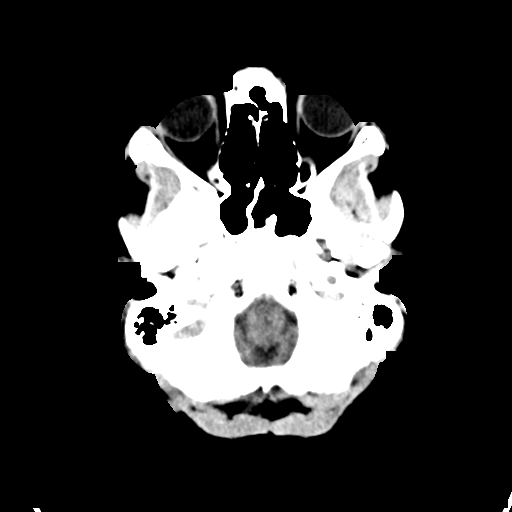
[im 5/34  bone]
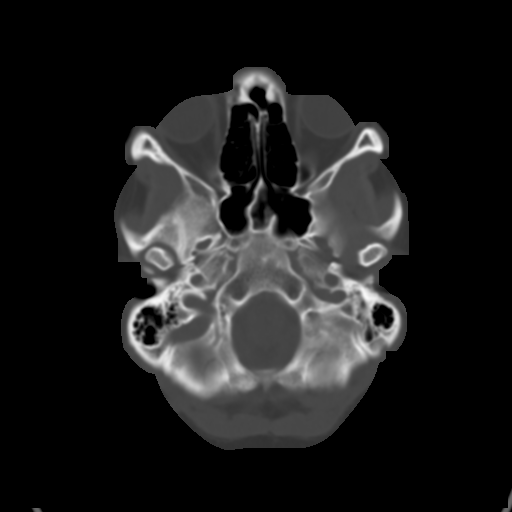
[im 9/34  brain]
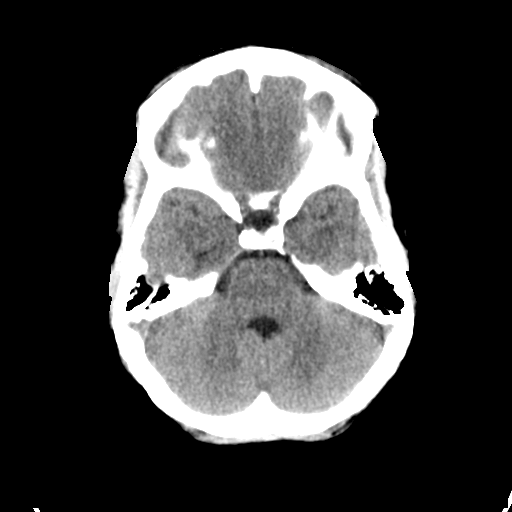
[im 13/34  brain]
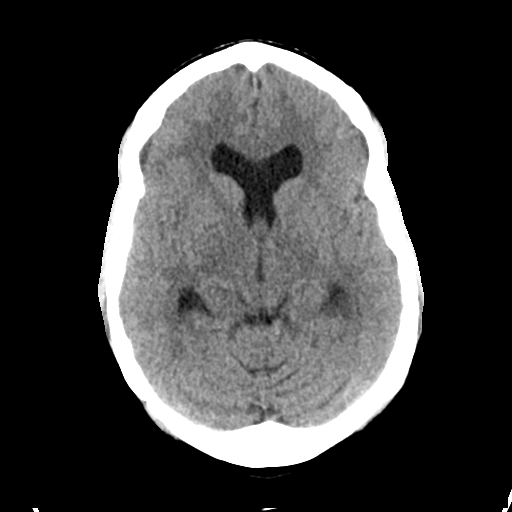
[im 17/34  brain]
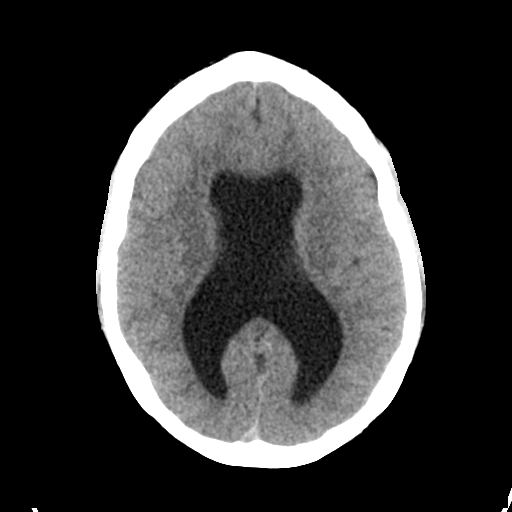
[im 21/34  brain]
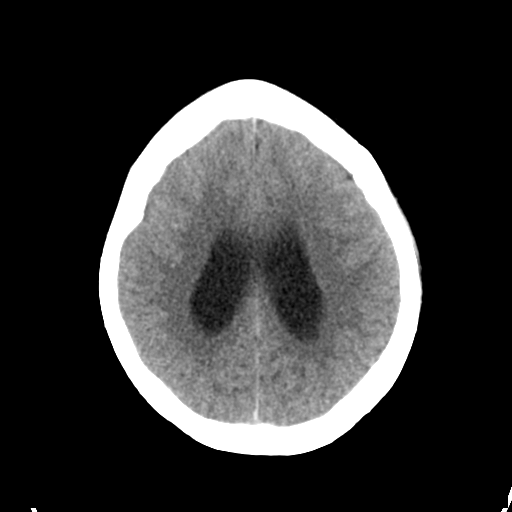
[im 21/34  bone]
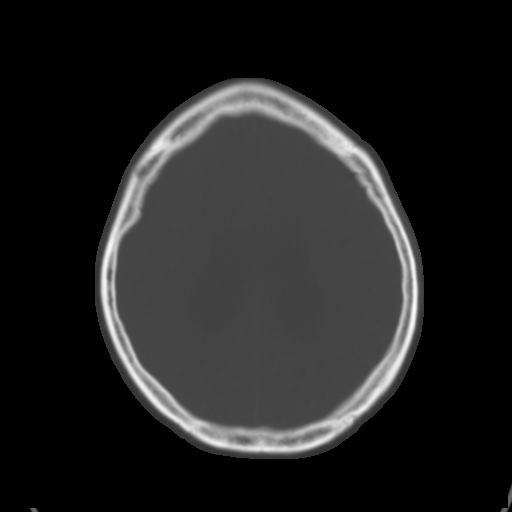
[im 25/34  brain]
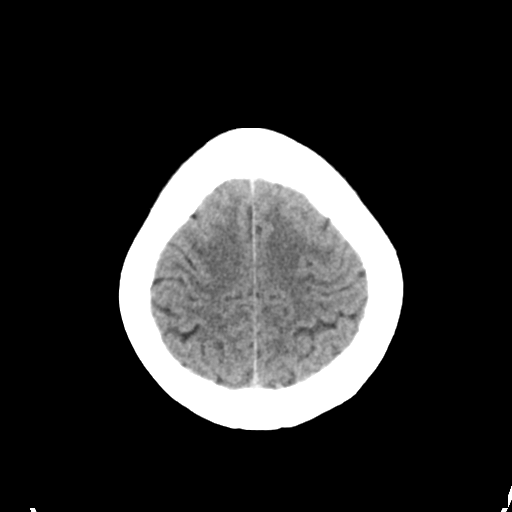
[im 29/34  brain]
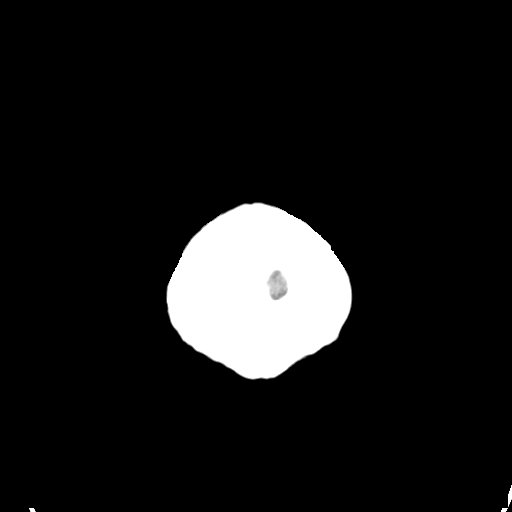

[Series 4: head bone · axial · 0.44mm/px · z∈[-101,-85]mm · 2 of 85 slices shown]
[im 9/85  bone]
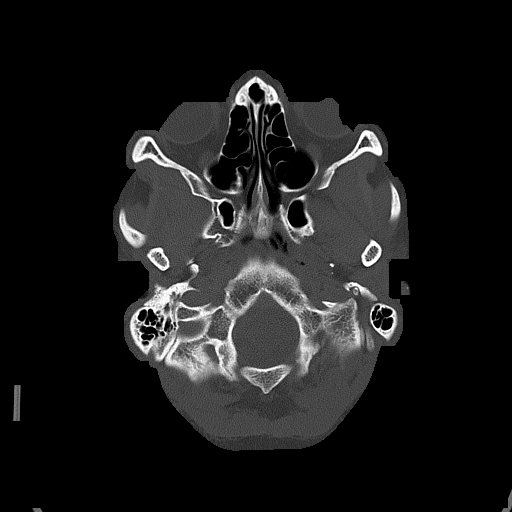
[im 17/85  bone]
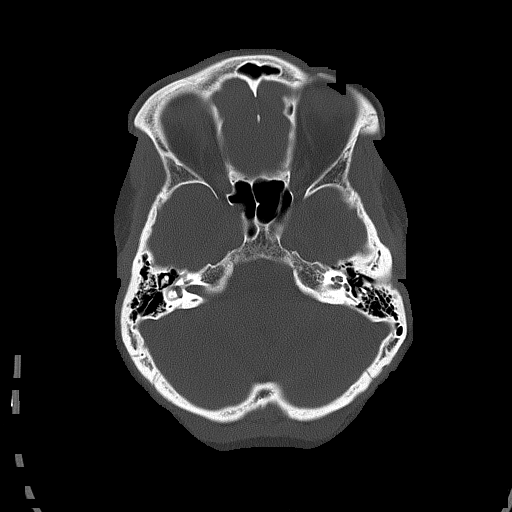

[Series 5: head without cor · coronal · non-contrast · 0.33mm/px · 3 of 68 slices shown]
[im 23/68  brain]
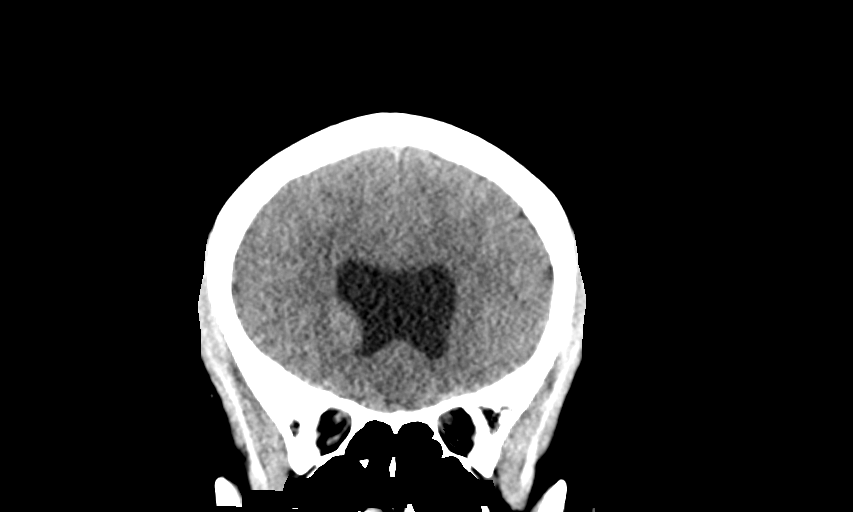
[im 30/68  brain]
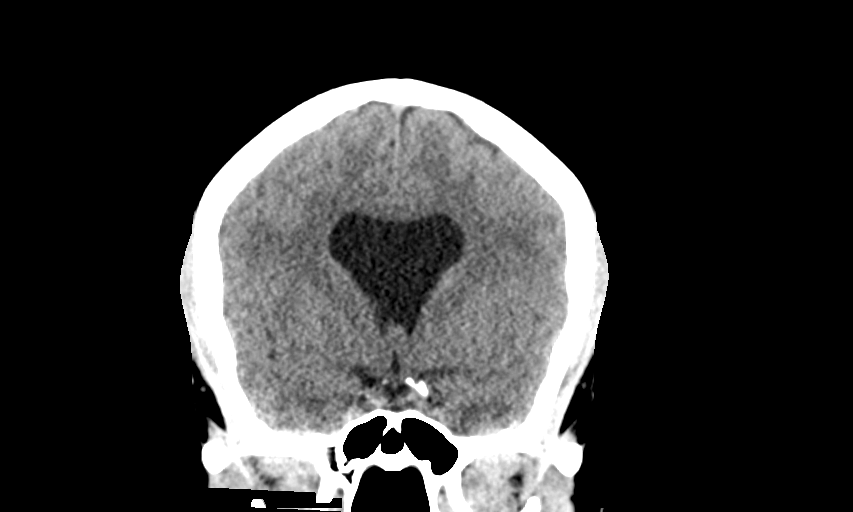
[im 38/68  brain]
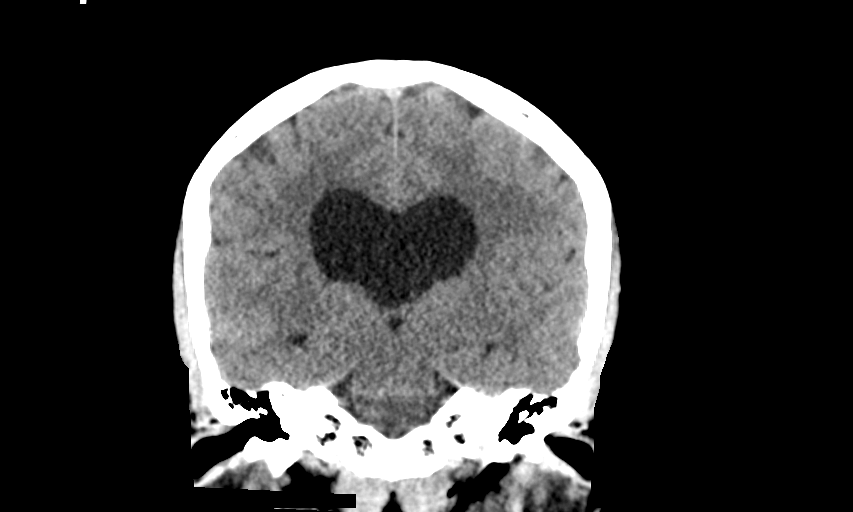

[Series 6: head without sag · sagittal · non-contrast · 0.33mm/px · 3 of 59 slices shown]
[im 20/59  brain]
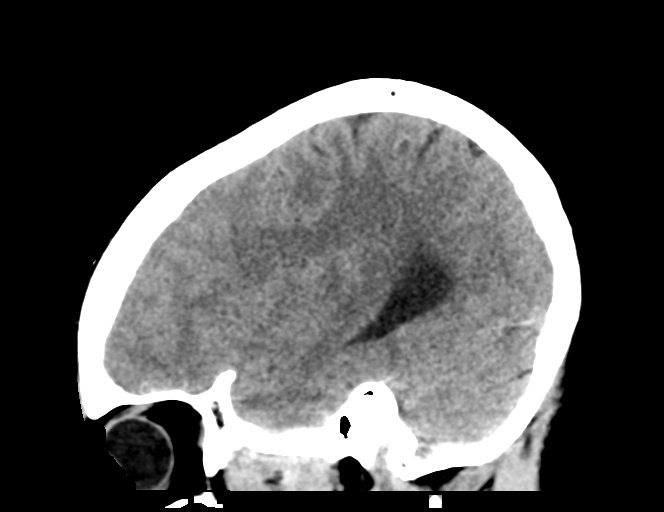
[im 30/59  brain]
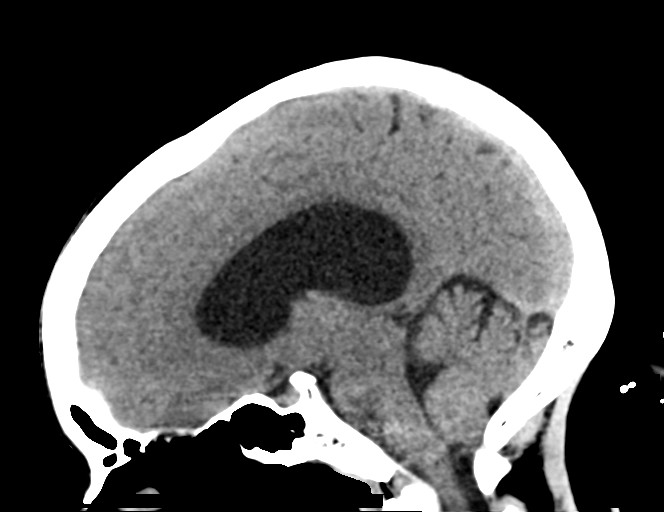
[im 39/59  brain]
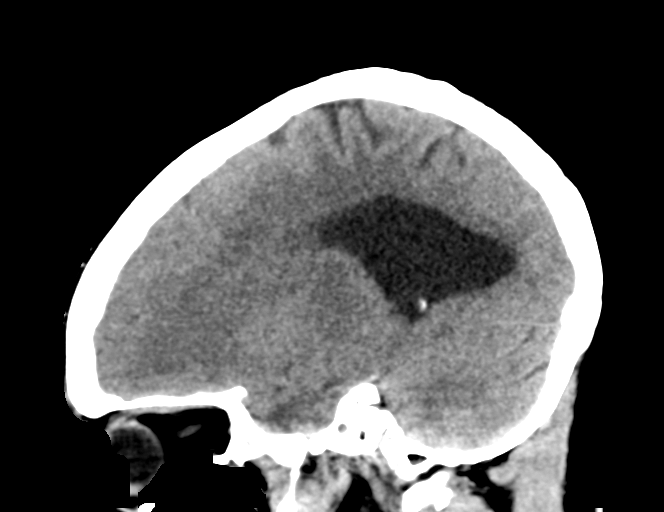

[15 of 47 positions shown; findings below may reference images not displayed]

FINDINGS: Brain: Diffuse dilatation of the lateral ventricles. Absence of the
septum pellucidum. Decompressed third and fourth ventricles. No
midline shift or mass effect. Normal appearance of brain parenchyma
otherwise seen. No intracranial hemorrhage, mass lesion, or evidence
of acute infarction. No extra-axial fluid collections.

Vascular: No hyperdense vessels

Skull: Intact

Sinuses/Orbits: Clear

Other: N/A
IMPRESSION: Dysmorphic ventricular system and absence of the septum pellucidum.

No definite acute intracranial abnormalities.

## 2022-01-05 ENCOUNTER — Ambulatory Visit: Payer: No Typology Code available for payment source | Admitting: Nurse Practitioner

## 2022-02-18 ENCOUNTER — Encounter: Payer: Self-pay | Admitting: Nurse Practitioner

## 2022-02-18 ENCOUNTER — Ambulatory Visit (INDEPENDENT_AMBULATORY_CARE_PROVIDER_SITE_OTHER): Payer: No Typology Code available for payment source | Admitting: Nurse Practitioner

## 2022-02-18 VITALS — BP 120/76 | Ht 64.0 in | Wt 150.0 lb

## 2022-02-18 DIAGNOSIS — Z113 Encounter for screening for infections with a predominantly sexual mode of transmission: Secondary | ICD-10-CM | POA: Diagnosis not present

## 2022-02-18 DIAGNOSIS — E559 Vitamin D deficiency, unspecified: Secondary | ICD-10-CM

## 2022-02-18 DIAGNOSIS — Z01419 Encounter for gynecological examination (general) (routine) without abnormal findings: Secondary | ICD-10-CM

## 2022-02-18 DIAGNOSIS — N898 Other specified noninflammatory disorders of vagina: Secondary | ICD-10-CM

## 2022-02-18 DIAGNOSIS — F3281 Premenstrual dysphoric disorder: Secondary | ICD-10-CM

## 2022-02-18 LAB — WET PREP FOR TRICH, YEAST, CLUE

## 2022-02-18 NOTE — Progress Notes (Signed)
? ?Candice Bowman Feb 15, 1991 [id number] ? ? ?History:  31 y.o. [id number] presents for annual exam. Monthly cycles, using natural family planning for contraception. Has depressive symptoms 2 weeks prior to menses. She has always had this but is now noticing it more and has been tracking moods. She does feel it was worse during the winter months. Denies SI. She is working on ways to cope with this and plans to see therapist again. Complains of vaginal discharge without itching or odor. Normal pap history.  ? ?Gynecologic History ?Patient's last menstrual period was 02/09/2022. ?Period Cycle (Days): 24 ?Period Duration (Days): 5 ?Period Pattern: Regular ?Menstrual Flow: Moderate ?Dysmenorrhea: (!) Mild ?Dysmenorrhea Symptoms: Cramping ?Contraception/Family planning: rhythm method ?Sexually active: Yes ? ?Health Maintenance ?Last Pap: 11/09/2018. Results were: Normal, 5-year repeat ?Last mammogram: N/A ?Last colonoscopy: N/A  ?Last Dexa: N/A ? ?Past medical history, past surgical history, family history and social history were all reviewed and documented in the EPIC chart. Twins - 31 yo boy & girl. Works for [employer].  ? ?ROS:  A ROS was performed and pertinent positives and negatives are included. ? ?Exam: ? ?Vitals:  ? 02/18/22 1609  ?BP: 120/76  ?Weight: 150 lb (68 kg)  ?Height: 5\' 4"  (1.626 m)  ? ? ?Body mass index is 25.75 kg/m?. ? ?General appearance:  Normal ?Thyroid:  Symmetrical, normal in size, without palpable masses or nodularity. ?Respiratory ? Auscultation:  Clear without wheezing or rhonchi ?Cardiovascular ? Auscultation:  Regular rate, without rubs, murmurs or gallops ? Edema/varicosities:  Not grossly evident ?Abdominal ? Soft,nontender, without masses, guarding or rebound. ? Liver/spleen:  No organomegaly noted ? Hernia:  None appreciated ? Steri strip along lower abdomen from recent surgery, healing well ? Skin ? Inspection:  Grossly normal ?Breasts: Examined lying and sitting.  ? Right: Without masses,  retractions, nipple discharge or axillary adenopathy. ? ? Left: Without masses, retractions, nipple discharge or axillary adenopathy. ?Gentitourinary  ? Inguinal/mons:  Normal without inguinal adenopathy ? External genitalia:  Normal appearing vulva with no masses, tenderness, or lesions ? BUS/Urethra/Skene's glands:  Normal ? Vagina:  Normal appearing with normal color and discharge, no lesions ? Cervix:  Normal appearing without discharge or lesions ? Uterus:  Normal in size, shape and contour.  Midline and mobile, nontender ? Adnexa/parametria:   ?  Rt: Normal in size, without masses or tenderness. ?  Lt: Normal in size, without masses or tenderness. ? Anus and perineum: Normal ? ?Patient informed chaperone available to be present for breast and pelvic exam. Patient has requested no chaperone to be present. Patient has been advised what will be completed during breast and pelvic exam.  ? ?Wet prep negative ? ? ?Assessment/Plan:  31 y.o. G1P1001 for annual exam.  ? ?Well female exam with routine gynecological exam - Plan: CBC with Differential/Platelet, Comprehensive metabolic panel. Education provided on SBEs, importance of preventative screenings, current guidelines, high calcium diet, regular exercise, and multivitamin daily.  ? ?Vitamin D deficiency - Plan: VITAMIN D 25 Hydroxy (Vit-D Deficiency, Fractures) ? ?Vaginal discharge - Plan: [place], CLUE. Wet prep negative.  ? ?Screen for STD (sexually transmitted disease) - Plan: HIV Antibody (routine testing w rflx), RPR, C. trachomatis/N. gonorrhoeae RNA ? ?PMDD (premenstrual dysphoric disorder) - Discussed management with hormonal contraception and/or SSRI/SNRI. She is hesitant to begin either of these and wants to continue to monitor symptoms and seeing therapy. She will reach out if she changes her mind. ? ?Screening for cervical cancer -  Normal pap history. Will repeat at 5-year interval per guidelines.  ? ?Return in 1 year for annual.   ? ? ? ? ?Tamela Gammon DNP, 4:40 PM 02/18/2022 ? ?

## 2022-02-19 LAB — COMPREHENSIVE METABOLIC PANEL
AG Ratio: 1.3 (calc) (ref 1.0–2.5)
ALT: 22 U/L (ref 6–29)
AST: 17 U/L (ref 10–30)
Albumin: 4.6 g/dL (ref 3.6–5.1)
Alkaline phosphatase (APISO): 75 U/L (ref 31–125)
BUN: 13 mg/dL (ref 7–25)
CO2: 25 mmol/L (ref 20–32)
Calcium: 9.9 mg/dL (ref 8.6–10.2)
Chloride: 102 mmol/L (ref 98–110)
Creat: 0.9 mg/dL (ref 0.50–0.97)
Globulin: 3.5 g/dL (calc) (ref 1.9–3.7)
Glucose, Bld: 113 mg/dL — ABNORMAL HIGH (ref 65–99)
Potassium: 4 mmol/L (ref 3.5–5.3)
Sodium: 139 mmol/L (ref 135–146)
Total Bilirubin: 0.6 mg/dL (ref 0.2–1.2)
Total Protein: 8.1 g/dL (ref 6.1–8.1)

## 2022-02-19 LAB — CBC WITH DIFFERENTIAL/PLATELET
Absolute Monocytes: 643 cells/uL (ref 200–950)
Basophils Absolute: 120 cells/uL (ref 0–200)
Basophils Relative: 1.1 %
Eosinophils Absolute: 87 cells/uL (ref 15–500)
Eosinophils Relative: 0.8 %
HCT: 37.6 % (ref 35.0–45.0)
Hemoglobin: 12.2 g/dL (ref 11.7–15.5)
Lymphs Abs: 1722 cells/uL (ref 850–3900)
MCH: 27.5 pg (ref 27.0–33.0)
MCHC: 32.4 g/dL (ref 32.0–36.0)
MCV: 84.9 fL (ref 80.0–100.0)
MPV: 10.6 fL (ref 7.5–12.5)
Monocytes Relative: 5.9 %
Neutro Abs: 8328 cells/uL — ABNORMAL HIGH (ref 1500–7800)
Neutrophils Relative %: 76.4 %
Platelets: 357 10*3/uL (ref 140–400)
RBC: 4.43 10*6/uL (ref 3.80–5.10)
RDW: 13.9 % (ref 11.0–15.0)
Total Lymphocyte: 15.8 %
WBC: 10.9 10*3/uL — ABNORMAL HIGH (ref 3.8–10.8)

## 2022-02-19 LAB — VITAMIN D 25 HYDROXY (VIT D DEFICIENCY, FRACTURES): Vit D, 25-Hydroxy: 43 ng/mL (ref 30–100)

## 2022-02-19 LAB — C. TRACHOMATIS/N. GONORRHOEAE RNA
C. trachomatis RNA, TMA: NOT DETECTED
N. gonorrhoeae RNA, TMA: NOT DETECTED

## 2022-02-19 LAB — HIV ANTIBODY (ROUTINE TESTING W REFLEX): HIV 1&2 Ab, 4th Generation: NONREACTIVE

## 2022-02-19 LAB — RPR: RPR Ser Ql: NONREACTIVE

## 2022-02-23 ENCOUNTER — Telehealth: Payer: Self-pay

## 2022-02-23 NOTE — Telephone Encounter (Signed)
Patient calling about her lab results from 02/18/22. ?

## 2022-02-23 NOTE — Telephone Encounter (Signed)
Called patient back but her voicemail box is full.  ?

## 2022-02-23 NOTE — Telephone Encounter (Signed)
I have sent a couple of messages through [place] regarding her results.  ?

## 2022-07-15 ENCOUNTER — Ambulatory Visit (INDEPENDENT_AMBULATORY_CARE_PROVIDER_SITE_OTHER): Payer: No Typology Code available for payment source | Admitting: Nurse Practitioner

## 2022-07-15 ENCOUNTER — Encounter: Payer: Self-pay | Admitting: Nurse Practitioner

## 2022-07-15 VITALS — BP 92/60 | HR 84

## 2022-07-15 DIAGNOSIS — N949 Unspecified condition associated with female genital organs and menstrual cycle: Secondary | ICD-10-CM

## 2022-07-15 DIAGNOSIS — R1032 Left lower quadrant pain: Secondary | ICD-10-CM | POA: Diagnosis not present

## 2022-07-15 DIAGNOSIS — Z113 Encounter for screening for infections with a predominantly sexual mode of transmission: Secondary | ICD-10-CM

## 2022-07-15 DIAGNOSIS — L680 Hirsutism: Secondary | ICD-10-CM | POA: Diagnosis not present

## 2022-07-15 LAB — WET PREP FOR TRICH, YEAST, CLUE

## 2022-07-15 NOTE — Progress Notes (Signed)
   Acute Office Visit  Subjective:    Patient ID: Candice Bowman, female    DOB: 1991-02-15, 31 y.o.   MRN: [id number]   HPI 31 y.o. presents today for LLQ abdominal pain and vaginal discomfort since having intercourse last week. Denies itching, discharge, or odor. Does report constipation. Had bowel movement yesterday that was very hard. Pain is crampy and intermittent. She complains of worsening hirsutism along chin/jaw line and on chest.    Review of Systems  Constitutional: Negative.   Gastrointestinal:  Positive for abdominal pain and constipation. Negative for blood in stool, diarrhea, nausea and vomiting.  Genitourinary:  Positive for vaginal pain. Negative for vaginal discharge.       Objective:    Physical Exam Constitutional:      Appearance: Normal appearance.  Abdominal:     Tenderness: There is abdominal tenderness in the left lower quadrant. There is no guarding or rebound.  Genitourinary:    General: Normal vulva.     Vagina: Normal.     Cervix: Normal.     Uterus: Normal.      BP 92/60   Pulse 84   LMP 07/01/2022 (Exact Date)   SpO2 99%  Wt Readings from Last 3 Encounters:  02/18/22 150 lb (68 kg)  02/17/21 153 lb (69.4 kg)  12/27/19 149 lb (67.6 kg)        Patient informed chaperone available to be present for breast and/or pelvic exam. Patient has requested no chaperone to be present. Patient has been advised what will be completed during breast and pelvic exam.   Wet prep negative  Assessment & Plan:   Problem List Items Addressed This Visit   None Visit Diagnoses     LLQ abdominal pain    -  Primary   Vaginal discomfort       Relevant Orders   WET PREP FOR [employer], YEAST, CLUE   Hirsutism       Relevant Orders   Testos,Total,Free and SHBG (Female)   Screen for STD (sexually transmitted disease)       Relevant Orders   C. trachomatis/N. gonorrhoeae RNA      Plan: Negative wet prep and vaginal exam. STD panel pending. Will  check testosterone. If testosterone normal, dermatology recommended. LLQ pain likely GI in nature. Recommend increasing water and fiber intake, starting stool softener and/or laxative.      Tamela Gammon DNP, 2:18 PM 07/15/2022

## 2022-07-16 LAB — C. TRACHOMATIS/N. GONORRHOEAE RNA
C. trachomatis RNA, TMA: NOT DETECTED
N. gonorrhoeae RNA, TMA: NOT DETECTED

## 2022-07-18 LAB — TESTOS,TOTAL,FREE AND SHBG (FEMALE)
Free Testosterone: 5.3 pg/mL (ref 0.1–6.4)
Sex Hormone Binding: 13 nmol/L — ABNORMAL LOW (ref 17–124)
Testosterone, Total, LC-MS-MS: 20 ng/dL (ref 2–45)

## 2023-04-15 ENCOUNTER — Ambulatory Visit: Payer: No Typology Code available for payment source | Admitting: Nurse Practitioner

## 2023-04-21 ENCOUNTER — Ambulatory Visit: Payer: No Typology Code available for payment source | Admitting: Obstetrics and Gynecology

## 2023-04-27 ENCOUNTER — Ambulatory Visit (INDEPENDENT_AMBULATORY_CARE_PROVIDER_SITE_OTHER): Payer: No Typology Code available for payment source | Admitting: Nurse Practitioner

## 2023-04-27 ENCOUNTER — Encounter: Payer: Self-pay | Admitting: Nurse Practitioner

## 2023-04-27 VITALS — BP 108/62 | HR 77 | Wt 158.0 lb

## 2023-04-27 DIAGNOSIS — Z113 Encounter for screening for infections with a predominantly sexual mode of transmission: Secondary | ICD-10-CM | POA: Diagnosis not present

## 2023-04-27 DIAGNOSIS — N760 Acute vaginitis: Secondary | ICD-10-CM

## 2023-04-27 DIAGNOSIS — N898 Other specified noninflammatory disorders of vagina: Secondary | ICD-10-CM

## 2023-04-27 DIAGNOSIS — N926 Irregular menstruation, unspecified: Secondary | ICD-10-CM | POA: Diagnosis not present

## 2023-04-27 LAB — PREGNANCY, URINE: Preg Test, Ur: NEGATIVE

## 2023-04-27 LAB — WET PREP FOR TRICH, YEAST, CLUE

## 2023-04-27 MED ORDER — METRONIDAZOLE 500 MG PO TABS
500.0000 mg | ORAL_TABLET | Freq: Two times a day (BID) | ORAL | 0 refills | Status: DC
Start: 2023-04-27 — End: 2023-06-29

## 2023-04-27 NOTE — Progress Notes (Signed)
   Acute Office Visit  Subjective:    Patient ID: Candice Bowman, female    DOB: 1991/07/01, 32 y.o.   MRN: [id number]   HPI 32 y.o. presents today for STD screening. New partner. Has noticed an odor without discharge or itching. Last period was different than her normal. She had heavy bleeding x 2 days then spotted.   Patient's last menstrual period was 04/15/2023 (exact date).    Review of Systems  Constitutional: Negative.   Genitourinary:  Positive for menstrual problem. Negative for vaginal discharge and vaginal pain.       Vaginal odor       Objective:    Physical Exam Constitutional:      Appearance: Normal appearance.  Genitourinary:    General: Normal vulva.     Vagina: Vaginal discharge present. No erythema.     Cervix: Normal.     BP 108/62   Pulse 77   Wt 158 lb (71.7 kg)   LMP 04/15/2023 (Exact Date)   SpO2 100%   Breastfeeding No   BMI 27.12 kg/m  Wt Readings from Last 3 Encounters:  04/27/23 158 lb (71.7 kg)  02/18/22 150 lb (68 kg)  02/17/21 153 lb (69.4 kg)        Patient informed chaperone available to be present for breast and/or pelvic exam. Patient has requested no chaperone to be present. Patient has been advised what will be completed during breast and pelvic exam.   UPT negative  Wet prep + clue cells (+ odor)  Assessment & Plan:   Problem List Items Addressed This Visit   None Visit Diagnoses     Bacterial vaginosis    -  Primary   Relevant Medications   metroNIDAZOLE (FLAGYL) 500 MG tablet   Screening examination for STD (sexually transmitted disease)       Relevant Orders   C. trachomatis/N. gonorrhoeae RNA   RPR   HIV Antibody (routine testing w rflx)   Vaginal odor       Relevant Orders   C. trachomatis/N. gonorrhoeae RNA   WET PREP FOR [place], YEAST, CLUE   Menstrual changes       Relevant Orders   Pregnancy, urine      Plan: UPT negative. STD panel pending. Wet prep positive for clue cells - Flagyl 500  mg BID x 7 days.      Olivia Mackie DNP, 10:38 AM 04/27/2023

## 2023-04-28 LAB — C. TRACHOMATIS/N. GONORRHOEAE RNA
C. trachomatis RNA, TMA: NOT DETECTED
N. gonorrhoeae RNA, TMA: NOT DETECTED

## 2023-06-29 ENCOUNTER — Other Ambulatory Visit (HOSPITAL_COMMUNITY)
Admission: RE | Admit: 2023-06-29 | Discharge: 2023-06-29 | Disposition: A | Discharge status: Home / Self Care | Payer: 59 | Source: Ambulatory Visit | Attending: Nurse Practitioner | Admitting: Nurse Practitioner

## 2023-06-29 ENCOUNTER — Ambulatory Visit (INDEPENDENT_AMBULATORY_CARE_PROVIDER_SITE_OTHER): Payer: No Typology Code available for payment source | Admitting: Nurse Practitioner

## 2023-06-29 ENCOUNTER — Encounter: Payer: Self-pay | Admitting: Nurse Practitioner

## 2023-06-29 VITALS — BP 112/60 | HR 78 | Ht 64.0 in | Wt 159.0 lb

## 2023-06-29 DIAGNOSIS — L680 Hirsutism: Secondary | ICD-10-CM

## 2023-06-29 DIAGNOSIS — Z124 Encounter for screening for malignant neoplasm of cervix: Secondary | ICD-10-CM | POA: Insufficient documentation

## 2023-06-29 DIAGNOSIS — Z01419 Encounter for gynecological examination (general) (routine) without abnormal findings: Secondary | ICD-10-CM | POA: Diagnosis not present

## 2023-06-29 DIAGNOSIS — E559 Vitamin D deficiency, unspecified: Secondary | ICD-10-CM

## 2023-06-29 NOTE — Progress Notes (Signed)
   Candice Bowman 12-Sep-1991 [id number]   History:  32 y.o. G1P1002 presents for annual exam. Monthly cycles, using natural family planning for contraception. Complains of excessive hair growth. Not new. Had elevated SHBG last year. Hormonal contraception and/or spironolactone recommended as options for management. Normal pap history. Negative STD screening 04/27/23. H/O vit d deficiency, just restarted supplement.   Gynecologic History Patient's last menstrual period was 06/21/2023. Period Cycle (Days): 28 Period Duration (Days): 5 Period Pattern: Regular Menstrual Flow: Moderate Menstrual Control: Maxi pad Menstrual Control Change Freq (Hours): 2 Dysmenorrhea: None Contraception/Family planning: rhythm method Sexually active: Yes  Health Maintenance Last Pap: 11/09/2018. Results were: Normal neg HPV, 5-year repeat Last mammogram: Not indicated Last colonoscopy: Not indicated Last Dexa: Not indicated  Past medical history, past surgical history, family history and social history were all reviewed and documented in the EPIC chart. Twins - 32 yo boy & girl. Works for [employer].   ROS:  A ROS was performed and pertinent positives and negatives are included.  Exam:  Vitals:   06/29/23 1026  BP: 112/60  Pulse: 78  SpO2: 100%  Weight: 159 lb (72.1 kg)  Height: 5\' 4"  (1.626 m)     Body mass index is 27.29 kg/m.  General appearance:  Normal Thyroid:  Symmetrical, normal in size, without palpable masses or nodularity. Respiratory  Auscultation:  Clear without wheezing or rhonchi Cardiovascular  Auscultation:  Regular rate, without rubs, murmurs or gallops  Edema/varicosities:  Not grossly evident Abdominal  Soft,nontender, without masses, guarding or rebound.  Liver/spleen:  No organomegaly noted  Hernia:  None appreciated  Steri strip along lower abdomen from recent surgery, healing well  Skin  Inspection:  Grossly normal Breasts: Examined lying and  sitting.   Right: Without masses, retractions, nipple discharge or axillary adenopathy.   Left: Without masses, retractions, nipple discharge or axillary adenopathy. Gentitourinary   Inguinal/mons:  Normal without inguinal adenopathy  External genitalia:  Normal appearing vulva with no masses, tenderness, or lesions  BUS/Urethra/Skene's glands:  Normal  Vagina:  Normal appearing with normal color and discharge, no lesions  Cervix:  Normal appearing without discharge or lesions  Uterus:  Normal in size, shape and contour.  Midline and mobile, nontender  Adnexa/parametria:     Rt: Normal in size, without masses or tenderness.   Lt: Normal in size, without masses or tenderness.  Anus and perineum: Normal  Patient informed chaperone available to be present for breast and pelvic exam. Patient has requested no chaperone to be present. Patient has been advised what will be completed during breast and pelvic exam.   Assessment/Plan:  32 y.o. G1P1001 for annual exam.   Well female exam with routine gynecological exam - Plan: CBC with Differential/Platelet, Comprehensive metabolic panel. Education provided on SBEs, importance of preventative screenings, current guidelines, high calcium diet, regular exercise, and multivitamin daily.   Vitamin D deficiency - Plan: VITAMIN D 25 Hydroxy (Vit-D Deficiency, Fractures)  Screening for cervical cancer - Plan: Cytology - PAP( [place]). Normal pap history.   Hirsutism - Discussed options for management with hormonal birth control, spironolactone, or derm referral. Wants to consider options.   Return in 1 year for annual.      Olivia Mackie DNP, 10:34 AM 06/29/2023

## 2023-06-30 LAB — COMPREHENSIVE METABOLIC PANEL
AG Ratio: 1.5 (calc) (ref 1.0–2.5)
ALT: 21 U/L (ref 6–29)
AST: 18 U/L (ref 10–30)
Albumin: 4.3 g/dL (ref 3.6–5.1)
Alkaline phosphatase (APISO): 73 U/L (ref 31–125)
BUN: 10 mg/dL (ref 7–25)
CO2: 23 mmol/L (ref 20–32)
Calcium: 8.9 mg/dL (ref 8.6–10.2)
Chloride: 105 mmol/L (ref 98–110)
Creat: 0.77 mg/dL (ref 0.50–0.97)
Globulin: 2.9 g/dL (ref 1.9–3.7)
Glucose, Bld: 66 mg/dL (ref 65–99)
Potassium: 4.2 mmol/L (ref 3.5–5.3)
Sodium: 140 mmol/L (ref 135–146)
Total Bilirubin: 0.4 mg/dL (ref 0.2–1.2)
Total Protein: 7.2 g/dL (ref 6.1–8.1)

## 2023-06-30 LAB — CBC WITH DIFFERENTIAL/PLATELET
Absolute Monocytes: 437 {cells}/uL (ref 200–950)
Basophils Absolute: 78 {cells}/uL (ref 0–200)
Basophils Relative: 1 %
Eosinophils Absolute: 78 {cells}/uL (ref 15–500)
Eosinophils Relative: 1 %
HCT: 36.6 % (ref 35.0–45.0)
Hemoglobin: 11.9 g/dL (ref 11.7–15.5)
Lymphs Abs: 1381 {cells}/uL (ref 850–3900)
MCH: 27.6 pg (ref 27.0–33.0)
MCHC: 32.5 g/dL (ref 32.0–36.0)
MCV: 84.9 fL (ref 80.0–100.0)
MPV: 10.3 fL (ref 7.5–12.5)
Monocytes Relative: 5.6 %
Neutro Abs: 5827 {cells}/uL (ref 1500–7800)
Neutrophils Relative %: 74.7 %
Platelets: 319 10*3/uL (ref 140–400)
RBC: 4.31 10*6/uL (ref 3.80–5.10)
RDW: 13.8 % (ref 11.0–15.0)
Total Lymphocyte: 17.7 %
WBC: 7.8 10*3/uL (ref 3.8–10.8)

## 2023-06-30 LAB — VITAMIN D 25 HYDROXY (VIT D DEFICIENCY, FRACTURES): Vit D, 25-Hydroxy: 24 ng/mL — ABNORMAL LOW (ref 30–100)

## 2023-07-01 LAB — CYTOLOGY - PAP
Comment: NEGATIVE
Diagnosis: UNDETERMINED — AB
High risk HPV: NEGATIVE

## 2023-07-02 ENCOUNTER — Telehealth: Payer: Self-pay

## 2023-07-02 NOTE — Telephone Encounter (Signed)
Pt LVM in triage line stating that she had some additional questions/desired clarification regarding pap test results.   Pt advised that since ASCUS was not noted to be currently coming from HPV virus since was not detected at the time of test then atypical cells could be present for benign reasons, such as, recent intercourse or hormonal changes.   Pt voiced understanding and appreciation for call. Routing to provider for final review and closing encounter.

## 2024-01-20 ENCOUNTER — Telehealth: Payer: Self-pay

## 2024-01-20 NOTE — Telephone Encounter (Signed)
 Sounds good. Thank you

## 2024-01-20 NOTE — Telephone Encounter (Signed)
 Pt LVM in triage line stating that she would like to schedule an exam and to also have some blood work done?  Last AEX in 06/2023 and per EMR investigation labs were done at that time as well.   Spoke w/ the pt and reports that she has been experiencing some hair breaking and wanted to have some labs done to check. Reports she specifically would like to have her Vit D levels checked since low in 06/2023 and her blood counts.   Pt also reports that she would like to have STI screen done since she usually likes to have done q6 months.   Pt scheduled for 02/10/2024. Routing to provider for final review and encounter closed.

## 2024-02-10 ENCOUNTER — Encounter: Payer: Self-pay | Admitting: Nurse Practitioner

## 2024-02-10 ENCOUNTER — Ambulatory Visit (INDEPENDENT_AMBULATORY_CARE_PROVIDER_SITE_OTHER): Admitting: Nurse Practitioner

## 2024-02-10 VITALS — BP 98/70 | HR 74

## 2024-02-10 DIAGNOSIS — L659 Nonscarring hair loss, unspecified: Secondary | ICD-10-CM | POA: Diagnosis not present

## 2024-02-10 DIAGNOSIS — Z113 Encounter for screening for infections with a predominantly sexual mode of transmission: Secondary | ICD-10-CM

## 2024-02-10 DIAGNOSIS — L680 Hirsutism: Secondary | ICD-10-CM | POA: Diagnosis not present

## 2024-02-10 NOTE — Progress Notes (Signed)
   Acute Office Visit  Subjective:    Patient ID: Candice Bowman, female    DOB: June 24, 1991, 33 y.o.   MRN: [id number]   HPI 33 y.o. presents today for hair breakage, abnormal hair growth on face and STD screening. Noticed hair loss for about a month with brushing and especially at the crown. H/O low Vit D - taking daily D3 + K2, h/o elevated SHGB. We have discussed management of hirsutism with hormonal birth control, spironolactone or derm referral. Not interested in birth control. Denies vaginal symptoms or known exposures.   Patient's last menstrual period was 01/30/2024 (exact date).    Review of Systems  Constitutional: Negative.   Skin:        Hair loss       Objective:    Physical Exam Constitutional:      Appearance: Normal appearance.  Genitourinary:    General: Normal vulva.     Vagina: Normal.     Cervix: Normal.     BP 98/70   Pulse 74   LMP 01/30/2024 (Exact Date)   SpO2 98%  Wt Readings from Last 3 Encounters:  06/29/23 159 lb (72.1 kg)  04/27/23 158 lb (71.7 kg)  02/18/22 150 lb (68 kg)        Patient informed chaperone available to be present for breast and/or pelvic exam. Patient has requested no chaperone to be present. Patient has been advised what will be completed during breast and pelvic exam.   Assessment & Plan:   Problem List Items Addressed This Visit   None Visit Diagnoses       Screening examination for STD (sexually transmitted disease)    -  Primary   Relevant Orders   RPR   HIV Antibody (routine testing w rflx)   SURESWAB CT/NG/T. vaginalis     Hair loss       Relevant Orders   Vitamin B12   CBC with Differential/Platelet   Comprehensive metabolic panel with GFR   VITAMIN D 25 Hydroxy (Vit-D Deficiency, Fractures)   TSH   Iron, TIBC and Ferritin Panel     Hirsutism          Plan: STD panel pending, labs pending. If labs are normal, derm recommended. We again discussed management of hirsutism.     Andee Bamberger DNP, 2:23 PM 02/10/2024

## 2024-02-12 LAB — COMPREHENSIVE METABOLIC PANEL WITH GFR
AG Ratio: 1.7 (calc) (ref 1.0–2.5)
ALT: 20 U/L (ref 6–29)
AST: 15 U/L (ref 10–30)
Albumin: 4.7 g/dL (ref 3.6–5.1)
Alkaline phosphatase (APISO): 67 U/L (ref 31–125)
BUN: 11 mg/dL (ref 7–25)
CO2: 25 mmol/L (ref 20–32)
Calcium: 9.2 mg/dL (ref 8.6–10.2)
Chloride: 105 mmol/L (ref 98–110)
Creat: 0.74 mg/dL (ref 0.50–0.97)
Globulin: 2.7 g/dL (ref 1.9–3.7)
Glucose, Bld: 84 mg/dL (ref 65–99)
Potassium: 3.9 mmol/L (ref 3.5–5.3)
Sodium: 138 mmol/L (ref 135–146)
Total Bilirubin: 0.4 mg/dL (ref 0.2–1.2)
Total Protein: 7.4 g/dL (ref 6.1–8.1)
eGFR: 110 mL/min/{1.73_m2} (ref >=60)

## 2024-02-12 LAB — CBC WITH DIFFERENTIAL/PLATELET
Absolute Lymphocytes: 1928 {cells}/uL (ref 850–3900)
Absolute Monocytes: 632 {cells}/uL (ref 200–950)
Basophils Absolute: 111 {cells}/uL (ref 0–200)
Basophils Relative: 1.4 %
Eosinophils Absolute: 174 {cells}/uL (ref 15–500)
Eosinophils Relative: 2.2 %
HCT: 36.7 % (ref 35.0–45.0)
Hemoglobin: 12 g/dL (ref 11.7–15.5)
MCH: 27.8 pg (ref 27.0–33.0)
MCHC: 32.7 g/dL (ref 32.0–36.0)
MCV: 85.2 fL (ref 80.0–100.0)
MPV: 10.6 fL (ref 7.5–12.5)
Monocytes Relative: 8 %
Neutro Abs: 5056 {cells}/uL (ref 1500–7800)
Neutrophils Relative %: 64 %
Platelets: 326 10*3/uL (ref 140–400)
RBC: 4.31 10*6/uL (ref 3.80–5.10)
RDW: 14.2 % (ref 11.0–15.0)
Total Lymphocyte: 24.4 %
WBC: 7.9 10*3/uL (ref 3.8–10.8)

## 2024-02-12 LAB — IRON,TIBC AND FERRITIN PANEL
%SAT: 9 % — ABNORMAL LOW (ref 16–45)
Ferritin: 6 ng/mL — ABNORMAL LOW (ref 16–154)
Iron: 37 ug/dL — ABNORMAL LOW (ref 40–190)
TIBC: 430 ug/dL (ref 250–450)

## 2024-02-12 LAB — SURESWAB CT/NG/T. VAGINALIS
C. trachomatis RNA, TMA: NOT DETECTED
N. gonorrhoeae RNA, TMA: NOT DETECTED
Trichomonas vaginalis RNA: NOT DETECTED

## 2024-02-12 LAB — TSH: TSH: 0.94 m[IU]/L

## 2024-02-12 LAB — VITAMIN D 25 HYDROXY (VIT D DEFICIENCY, FRACTURES): Vit D, 25-Hydroxy: 35 ng/mL (ref 30–100)

## 2024-02-12 LAB — VITAMIN B12: Vitamin B-12: 810 pg/mL (ref 200–1100)

## 2024-02-12 LAB — RPR: RPR Ser Ql: NONREACTIVE

## 2024-02-12 LAB — HIV ANTIBODY (ROUTINE TESTING W REFLEX): HIV 1&2 Ab, 4th Generation: NONREACTIVE

## 2024-02-13 ENCOUNTER — Other Ambulatory Visit: Payer: Self-pay | Admitting: Nurse Practitioner

## 2024-02-13 ENCOUNTER — Encounter: Payer: Self-pay | Admitting: Nurse Practitioner

## 2024-02-13 DIAGNOSIS — D509 Iron deficiency anemia, unspecified: Secondary | ICD-10-CM

## 2024-02-13 MED ORDER — FERROUS SULFATE 325 (65 FE) MG PO TBEC
325.0000 mg | DELAYED_RELEASE_TABLET | Freq: Two times a day (BID) | ORAL | 0 refills | Status: DC
Start: 2024-02-13 — End: 2024-08-18

## 2024-07-31 ENCOUNTER — Other Ambulatory Visit: Payer: Self-pay | Admitting: Nurse Practitioner

## 2024-07-31 DIAGNOSIS — D509 Iron deficiency anemia, unspecified: Secondary | ICD-10-CM

## 2024-08-10 ENCOUNTER — Encounter: Payer: Self-pay | Admitting: Nurse Practitioner

## 2024-08-10 ENCOUNTER — Other Ambulatory Visit

## 2024-08-10 ENCOUNTER — Ambulatory Visit: Admitting: Nurse Practitioner

## 2024-08-10 VITALS — BP 110/72 | HR 74 | Resp 16

## 2024-08-10 DIAGNOSIS — D500 Iron deficiency anemia secondary to blood loss (chronic): Secondary | ICD-10-CM

## 2024-08-10 DIAGNOSIS — N898 Other specified noninflammatory disorders of vagina: Secondary | ICD-10-CM

## 2024-08-10 DIAGNOSIS — Z113 Encounter for screening for infections with a predominantly sexual mode of transmission: Secondary | ICD-10-CM | POA: Diagnosis not present

## 2024-08-10 DIAGNOSIS — Z8639 Personal history of other endocrine, nutritional and metabolic disease: Secondary | ICD-10-CM

## 2024-08-10 NOTE — Progress Notes (Signed)
   Acute Office Visit  Subjective:    Patient ID: Candice Bowman, female    DOB: 10/04/1991, 33 y.o.   MRN: [id number]   HPI 33 y.o. presents today for STD screening. No known exposure or symptoms. New partner. Would like iron levels and Vit D checked today. H/O low vit D. Stopped supplement for a while but just restarted Vit D3 5000 international units. Prescribed ferrous sulfate  BID but switched to an OTC supplement for a while and recently switched back to ferrous sulfate .   Patient's last menstrual period was 08/03/2024 (exact date). Period Duration (Days): 5 Period Pattern: Regular Menstrual Flow: Moderate Menstrual Control: Maxi pad Dysmenorrhea: None  Review of Systems  Constitutional: Negative.   Genitourinary: Negative.        Objective:    Physical Exam Constitutional:      Appearance: Normal appearance.  Genitourinary:    General: Normal vulva.     Vagina: Vaginal discharge present. No erythema.     Cervix: Normal.     BP 110/72   Pulse 74   Resp 16   LMP 08/03/2024 (Exact Date)  Wt Readings from Last 3 Encounters:  06/29/23 159 lb (72.1 kg)  04/27/23 158 lb (71.7 kg)  02/18/22 150 lb (68 kg)        Zada Louder, CMA present as Biomedical engineer.   Assessment & Plan:   Problem List Items Addressed This Visit   None Visit Diagnoses       Screening examination for STD (sexually transmitted disease)    -  Primary   Relevant Orders   SureSwab Advanced Candida Vaginitis (CV), TMA   RPR   HIV Antibody (routine testing w rflx)     History of vitamin D  deficiency       Relevant Orders   VITAMIN D  25 Hydroxy (Vit-D Deficiency, Fractures)     Iron deficiency anemia due to chronic blood loss       Relevant Orders   CBC with Differential/Platelet   Iron, TIBC and Ferritin Panel     Vaginal discharge       Relevant Orders   SureSwab Advanced Candida Vaginitis (CV), TMA      Plan: STD panel, CBC, iron panel, Vit D pending. Recommend decreasing Vit  D to 1000-2000 international units daily.    Return if symptoms worsen or fail to improve.    Annabella DELENA Shutter DNP, 11:57 AM 08/10/2024

## 2024-08-11 LAB — CBC WITH DIFFERENTIAL/PLATELET
Absolute Lymphocytes: 1317 {cells}/uL (ref 850–3900)
Absolute Monocytes: 418 {cells}/uL (ref 200–950)
Basophils Absolute: 62 {cells}/uL (ref 0–200)
Basophils Relative: 0.7 %
Eosinophils Absolute: 18 {cells}/uL (ref 15–500)
Eosinophils Relative: 0.2 %
HCT: 40.7 % (ref 35.0–45.0)
Hemoglobin: 13.7 g/dL (ref 11.7–15.5)
MCH: 30.1 pg (ref 27.0–33.0)
MCHC: 33.7 g/dL (ref 32.0–36.0)
MCV: 89.5 fL (ref 80.0–100.0)
MPV: 10.1 fL (ref 7.5–12.5)
Monocytes Relative: 4.7 %
Neutro Abs: 7084 {cells}/uL (ref 1500–7800)
Neutrophils Relative %: 79.6 %
Platelets: 336 Thousand/uL (ref 140–400)
RBC: 4.55 Million/uL (ref 3.80–5.10)
RDW: 14.3 % (ref 11.0–15.0)
Total Lymphocyte: 14.8 %
WBC: 8.9 Thousand/uL (ref 3.8–10.8)

## 2024-08-11 LAB — RPR: RPR Ser Ql: NONREACTIVE

## 2024-08-11 LAB — SURESWAB® ADVANCED CANDIDA VAGINITIS (CV), TMA
CANDIDA SPECIES: NOT DETECTED
Candida glabrata: NOT DETECTED

## 2024-08-11 LAB — HIV ANTIBODY (ROUTINE TESTING W REFLEX)
HIV 1&2 Ab, 4th Generation: NONREACTIVE
HIV FINAL INTERPRETATION: NEGATIVE

## 2024-08-11 LAB — IRON,TIBC AND FERRITIN PANEL
%SAT: 11 % — ABNORMAL LOW (ref 16–45)
Ferritin: 13 ng/mL — ABNORMAL LOW (ref 16–154)
Iron: 41 ug/dL (ref 40–190)
TIBC: 390 ug/dL (ref 250–450)

## 2024-08-11 LAB — VITAMIN D 25 HYDROXY (VIT D DEFICIENCY, FRACTURES): Vit D, 25-Hydroxy: 36 ng/mL (ref 30–100)

## 2024-08-14 ENCOUNTER — Other Ambulatory Visit: Payer: Self-pay | Admitting: Nurse Practitioner

## 2024-08-14 DIAGNOSIS — D509 Iron deficiency anemia, unspecified: Secondary | ICD-10-CM

## 2024-08-18 ENCOUNTER — Other Ambulatory Visit: Payer: Self-pay | Admitting: Nurse Practitioner

## 2024-08-18 ENCOUNTER — Ambulatory Visit: Payer: Self-pay | Admitting: Nurse Practitioner

## 2024-08-18 DIAGNOSIS — D500 Iron deficiency anemia secondary to blood loss (chronic): Secondary | ICD-10-CM

## 2024-08-18 DIAGNOSIS — B9689 Other specified bacterial agents as the cause of diseases classified elsewhere: Secondary | ICD-10-CM

## 2024-08-18 LAB — SURESWAB® ADVANCED VAGINITIS PLUS,TMA
C. trachomatis RNA, TMA: NOT DETECTED
CANDIDA SPECIES: NOT DETECTED
Candida glabrata: NOT DETECTED
N. gonorrhoeae RNA, TMA: NOT DETECTED
SURESWAB(R) ADV BACTERIAL VAGINOSIS(BV),TMA: POSITIVE — AB
TRICHOMONAS VAGINALIS (TV),TMA: NOT DETECTED

## 2024-08-18 MED ORDER — METRONIDAZOLE 500 MG PO TABS
500.0000 mg | ORAL_TABLET | Freq: Two times a day (BID) | ORAL | 0 refills | Status: AC
Start: 2024-08-18 — End: ?

## 2024-08-18 MED ORDER — FERROUS SULFATE 325 (65 FE) MG PO TBEC: 325.0000 mg | DELAYED_RELEASE_TABLET | Freq: Two times a day (BID) | ORAL | 0 refills | Status: AC
